# Patient Record
Sex: Male | Born: 1975
Health system: Southern US, Community
[De-identification: ages and names within clinical notes are randomized; demographics above are authoritative.]

## PROBLEM LIST (undated history)

## (undated) DIAGNOSIS — F319 Bipolar disorder, unspecified: Secondary | ICD-10-CM

---

## 2011-07-10 ENCOUNTER — Emergency Department (HOSPITAL_COMMUNITY)
Admission: EM | Admit: 2011-07-10 | Discharge: 2011-07-10 | Disposition: A | Discharge status: Home / Self Care | Payer: Medicare Other | Attending: Emergency Medicine | Admitting: Emergency Medicine

## 2011-07-10 DIAGNOSIS — M5430 Sciatica, unspecified side: Secondary | ICD-10-CM | POA: Insufficient documentation

## 2011-07-10 DIAGNOSIS — X500XXA Overexertion from strenuous movement or load, initial encounter: Secondary | ICD-10-CM | POA: Insufficient documentation

## 2011-07-10 DIAGNOSIS — M545 Low back pain, unspecified: Secondary | ICD-10-CM | POA: Insufficient documentation

## 2011-07-10 DIAGNOSIS — M79609 Pain in unspecified limb: Secondary | ICD-10-CM | POA: Insufficient documentation

## 2011-07-10 DIAGNOSIS — Z79899 Other long term (current) drug therapy: Secondary | ICD-10-CM | POA: Insufficient documentation

## 2011-07-10 DIAGNOSIS — F0392 Unspecified dementia, unspecified severity, with psychotic disturbance: Secondary | ICD-10-CM | POA: Insufficient documentation

## 2011-07-10 DIAGNOSIS — F0390 Unspecified dementia without behavioral disturbance: Secondary | ICD-10-CM | POA: Insufficient documentation

## 2014-12-12 ENCOUNTER — Other Ambulatory Visit: Payer: Self-pay | Admitting: Family Medicine

## 2014-12-12 DIAGNOSIS — M545 Low back pain: Secondary | ICD-10-CM

## 2015-01-09 ENCOUNTER — Ambulatory Visit
Admission: RE | Admit: 2015-01-09 | Discharge: 2015-01-09 | Disposition: A | Discharge status: Home / Self Care | Payer: Medicare Other | Source: Ambulatory Visit | Attending: Family Medicine | Admitting: Family Medicine

## 2015-01-09 DIAGNOSIS — M545 Low back pain: Secondary | ICD-10-CM

## 2017-10-27 ENCOUNTER — Emergency Department (HOSPITAL_BASED_OUTPATIENT_CLINIC_OR_DEPARTMENT_OTHER): Payer: MEDICARE

## 2017-10-27 ENCOUNTER — Other Ambulatory Visit: Payer: Self-pay

## 2017-10-27 ENCOUNTER — Encounter (HOSPITAL_BASED_OUTPATIENT_CLINIC_OR_DEPARTMENT_OTHER): Payer: Self-pay

## 2017-10-27 ENCOUNTER — Emergency Department (HOSPITAL_BASED_OUTPATIENT_CLINIC_OR_DEPARTMENT_OTHER)
Admission: EM | Admit: 2017-10-27 | Discharge: 2017-10-27 | Disposition: A | Discharge status: Home / Self Care | Payer: MEDICARE | Attending: Emergency Medicine | Admitting: Emergency Medicine

## 2017-10-27 DIAGNOSIS — F17200 Nicotine dependence, unspecified, uncomplicated: Secondary | ICD-10-CM | POA: Insufficient documentation

## 2017-10-27 DIAGNOSIS — J9801 Acute bronchospasm: Secondary | ICD-10-CM | POA: Diagnosis not present

## 2017-10-27 DIAGNOSIS — J069 Acute upper respiratory infection, unspecified: Secondary | ICD-10-CM

## 2017-10-27 DIAGNOSIS — R05 Cough: Secondary | ICD-10-CM | POA: Diagnosis present

## 2017-10-27 HISTORY — DX: Bipolar disorder, unspecified: F31.9

## 2017-10-27 MED ORDER — AEROCHAMBER PLUS W/MASK MISC
1.0000 | Freq: Once | Status: AC
  Administered 2017-10-27: 1
  Filled 2017-10-27: qty 1

## 2017-10-27 MED ORDER — ALBUTEROL SULFATE HFA 108 (90 BASE) MCG/ACT IN AERS
2.0000 | INHALATION_SPRAY | Freq: Once | RESPIRATORY_TRACT | Status: AC
Start: 2017-10-27 — End: 2017-10-27
  Administered 2017-10-27: 2 via RESPIRATORY_TRACT

## 2017-10-27 MED ORDER — AZITHROMYCIN 250 MG PO TABS: ORAL_TABLET | ORAL | 0 refills | Status: DC

## 2017-10-27 MED ORDER — PREDNISONE 10 MG (21) PO TBPK: ORAL_TABLET | Freq: Every day | ORAL | 0 refills | Status: DC

## 2017-10-27 MED FILL — AZITHROMYCIN 250 MG TABLET: 250 | 5 days supply | Qty: 6 | Fill #0

## 2017-10-27 MED FILL — predniSONE 10 MG TABS: 10 | 12 days supply | Qty: 42 | Fill #0

## 2017-10-27 NOTE — Discharge Instructions (Signed)
Get help right away if: You have severe or persistent: Headache. Ear pain. Sinus pain. Chest pain. You have chronic lung disease and any of the following: Wheezing. Prolonged cough. Coughing up blood. A change in your usual mucus. You have a stiff neck. You have changes in your: Vision. Hearing. Thinking. Mood.

## 2017-10-27 NOTE — ED Provider Notes (Signed)
[place] EMERGENCY DEPARTMENT Provider Note   CSN: [id number] Arrival date & time: 10/27/17  1352     History   Chief Complaint Chief Complaint  Patient presents with  . Cough    HPI Jerry Woods is a 42 y.o. male who is a chronic daily smoker the presents emergency department with chief complaint of cough.  Patient states that his cough is been present for about 1 month.  It seemed to be getting better until about a week ago when he went to [place] to visit family and is acutely worsened.  He has the paroxysms of severe cough and breathlessness without vomiting.  He denies production of phlegm.  He does not have any fever or chills.  He has no previous history of back bronchospasm or asthma.  HPI  Past Medical History:  Diagnosis Date  . Bipolar disorder ([place])     There are no active problems to display for this patient.   History reviewed. No pertinent surgical history.     Home Medications    Prior to Admission medications   Not on File    Family History No family history on file.  Social History Social History   Tobacco Use  . Smoking status: Current Every Day Smoker  . Smokeless tobacco: Never Used  Substance Use Topics  . Alcohol use: No    Frequency: Never  . Drug use: No     Allergies   Penicillins and Strawberry (diagnostic)   Review of Systems Review of Systems  Positive for cough, negative for fever or chills. Physical Exam Updated Vital Signs BP (!) 130/94 (BP Location: Left Arm)   Pulse (!) 107   Temp 98.2 F (36.8 C) (Oral)   Resp 20   Ht 5' 10"  (1.778 m)   Wt 105.7 kg (233 lb)   SpO2 98%   BMI 33.43 kg/m   Physical Exam  Constitutional: He appears well-developed and well-nourished. No distress.  HENT:  Head: Normocephalic and atraumatic.  Eyes: Conjunctivae are normal. No scleral icterus.  Neck: Normal range of motion. Neck supple.  Cardiovascular: Normal rate, regular rhythm and normal heart sounds.    Pulmonary/Chest: Effort normal. No respiratory distress. He has wheezes.  Abdominal: Soft. There is no tenderness.  Musculoskeletal: He exhibits no edema.  Neurological: He is alert.  Skin: Skin is warm and dry. He is not diaphoretic.  Psychiatric: His behavior is normal.  Nursing note and vitals reviewed.    ED Treatments / Results  Labs (all labs ordered are listed, but only abnormal results are displayed) Labs Reviewed - No data to display  EKG  EKG Interpretation None       Radiology Dg Chest 2 View  Result Date: 10/27/2017 CLINICAL DATA:  Cough and congestion for 1 month.  Smoker. EXAM: CHEST  2 VIEW COMPARISON:  None. FINDINGS: There is some peribronchial thickening. No consolidative process, pneumothorax or effusion. Heart size is normal. No bony abnormality. IMPRESSION: Bronchitic change.  No focal process. Electronically Signed   By: Inge Rise M.D.   On: 10/27/2017 15:52    Procedures Procedures (including critical care time)  Medications Ordered in ED Medications  albuterol (PROVENTIL HFA;VENTOLIN HFA) 108 (90 Base) MCG/ACT inhaler 2 puff (not administered)  aerochamber plus with mask device 1 each (not administered)     Initial Impression / Assessment and Plan / ED Course  I have reviewed the triage vital signs and the nursing notes.  Pertinent labs & imaging results that  were available during my care of the patient were reviewed by me and considered in my medical decision making (see chart for details).     Patient with upper respiratory infection, given the course of his disease with improvement followed by worsening in symptoms we will treat prophylactically with antibiotics.  Patient is also appears to have a reactive airway component and will be treated with albuterol and prednisone.  I discussed return precautions with the patient.  He may use over-the-counter cough suppressants.  He appears appropriate for discharge at this time.  I discussed  smoking cessation with the patient including written materials, family support, tips for avoiding cigarette, stress and anxiety support.  Final Clinical Impressions(s) / ED Diagnoses   Final diagnoses:  Upper respiratory tract infection, unspecified type  Bronchospasm    ED Discharge Orders    None       Margarita Mail, PA-C 10/27/17 1637    Long, [facility], MD 10/28/17 [phone]

## 2017-10-27 NOTE — ED Triage Notes (Signed)
C/o flu like sx x 1 month-worse x 1 week-NAD-steady gait

## 2019-01-29 IMAGING — CR DG CHEST 2V
2 series · 2 of 2 positions shown · non-contrast
Comparison: None.

CLINICAL DATA: Cough and congestion for 1 month.  Smoker.

EXAM:
CHEST  2 VIEW

[w chest pa]
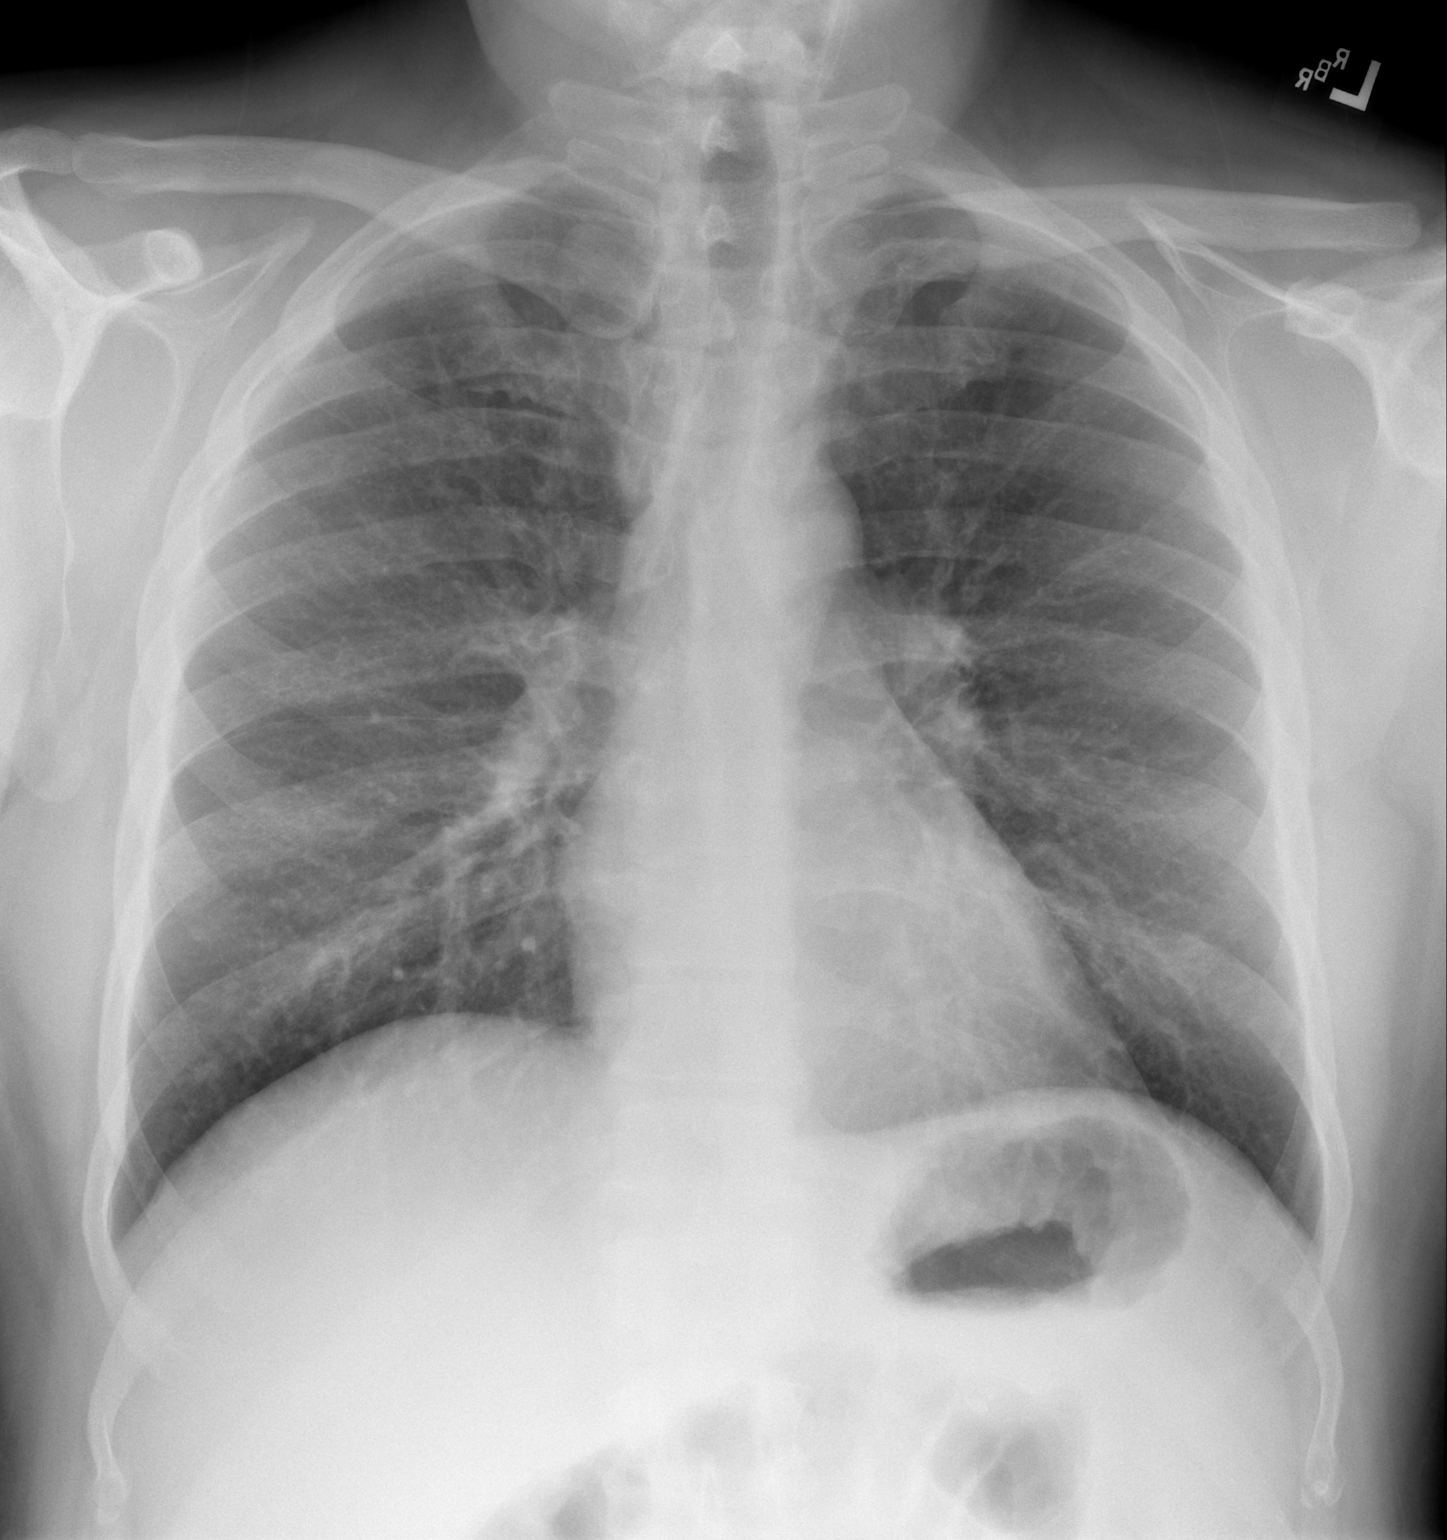

[w chest lat]
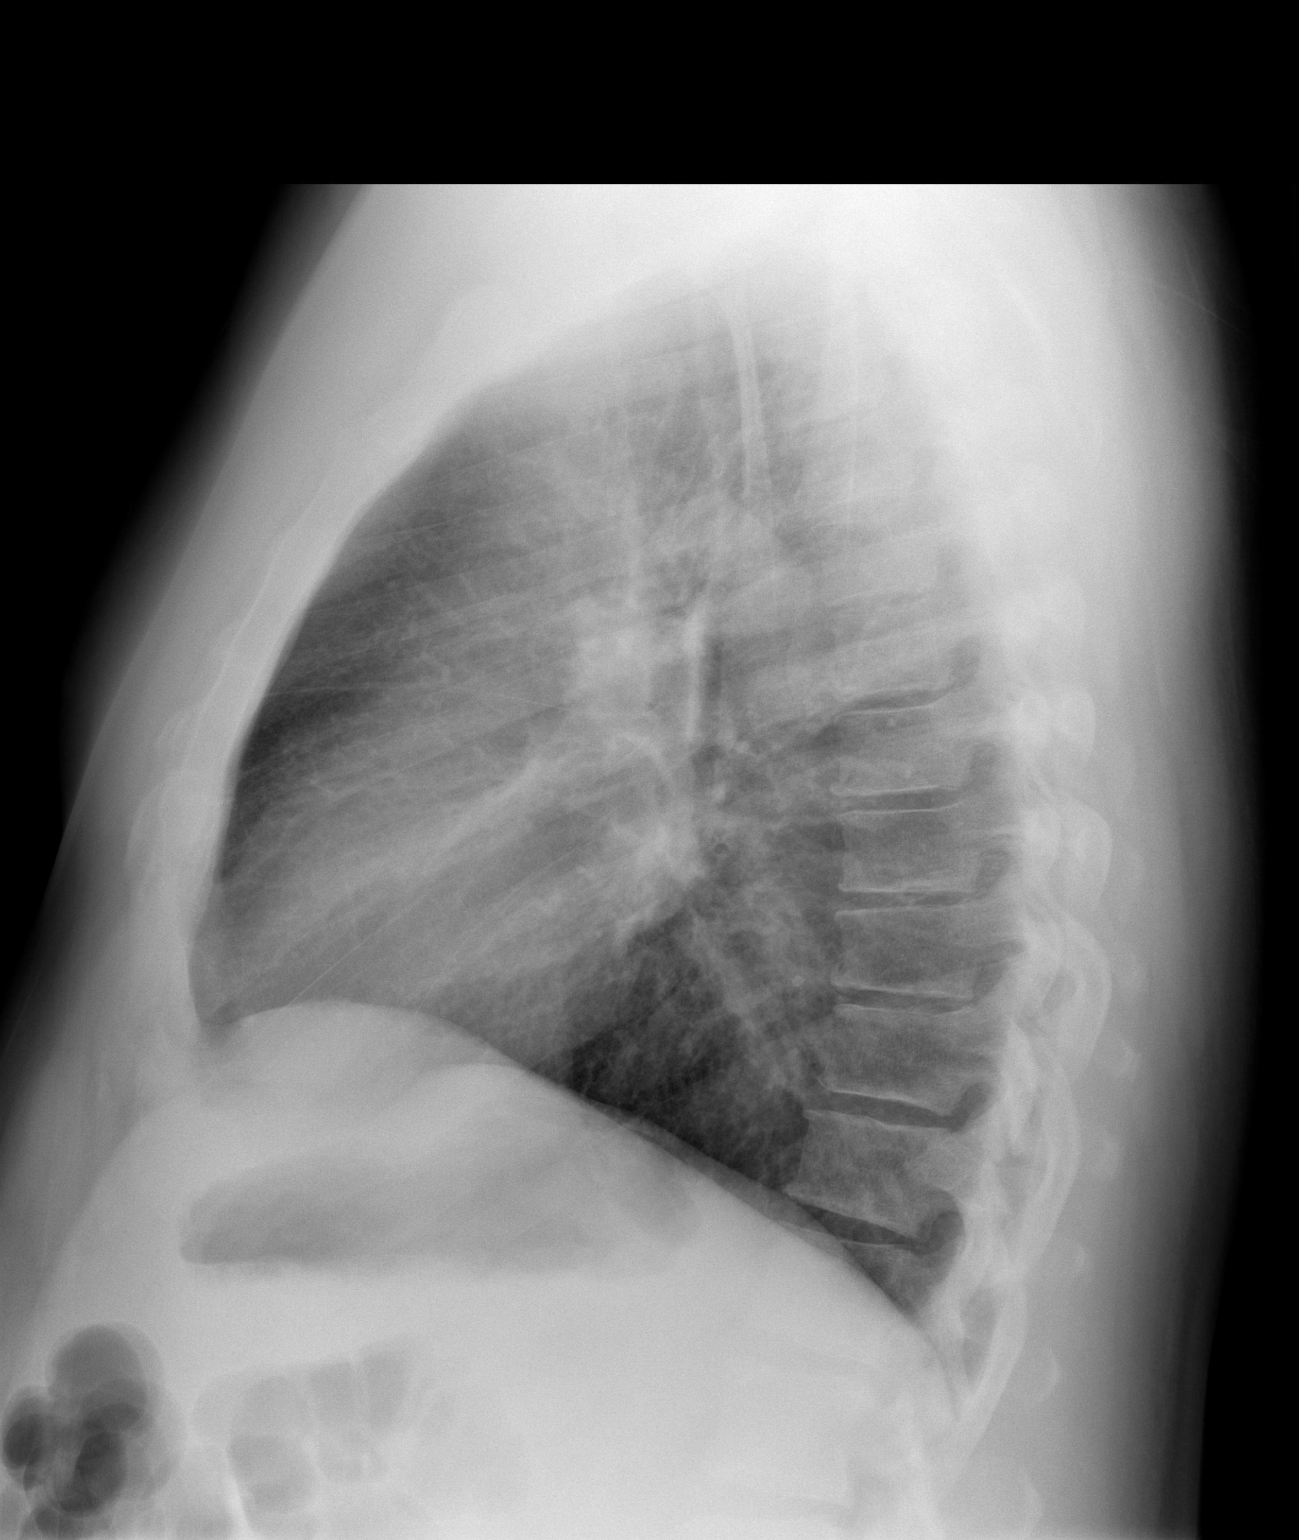

[2 of 2 positions shown; findings below may reference images not displayed]

FINDINGS: There is some peribronchial thickening. No consolidative process,
pneumothorax or effusion. Heart size is normal. No bony abnormality.
IMPRESSION: Bronchitic change.  No focal process.

## 2020-08-17 ENCOUNTER — Emergency Department (HOSPITAL_BASED_OUTPATIENT_CLINIC_OR_DEPARTMENT_OTHER): Payer: Medicare PPO

## 2020-08-17 ENCOUNTER — Inpatient Hospital Stay (HOSPITAL_BASED_OUTPATIENT_CLINIC_OR_DEPARTMENT_OTHER)
Admission: EM | Admit: 2020-08-17 | Discharge: 2020-08-21 | DRG: 177 | Disposition: A | Discharge status: Home / Self Care | Payer: Medicare PPO | Attending: Internal Medicine | Admitting: Internal Medicine

## 2020-08-17 ENCOUNTER — Encounter (HOSPITAL_BASED_OUTPATIENT_CLINIC_OR_DEPARTMENT_OTHER): Payer: Self-pay | Admitting: *Deleted

## 2020-08-17 ENCOUNTER — Other Ambulatory Visit: Payer: Self-pay

## 2020-08-17 DIAGNOSIS — Z880 Allergy status to penicillin: Secondary | ICD-10-CM

## 2020-08-17 DIAGNOSIS — J1282 Pneumonia due to coronavirus disease 2019: Secondary | ICD-10-CM | POA: Diagnosis present

## 2020-08-17 DIAGNOSIS — Z23 Encounter for immunization: Secondary | ICD-10-CM | POA: Diagnosis present

## 2020-08-17 DIAGNOSIS — E669 Obesity, unspecified: Secondary | ICD-10-CM | POA: Diagnosis present

## 2020-08-17 DIAGNOSIS — J9601 Acute respiratory failure with hypoxia: Secondary | ICD-10-CM | POA: Diagnosis not present

## 2020-08-17 DIAGNOSIS — F17210 Nicotine dependence, cigarettes, uncomplicated: Secondary | ICD-10-CM | POA: Diagnosis present

## 2020-08-17 DIAGNOSIS — Z91048 Other nonmedicinal substance allergy status: Secondary | ICD-10-CM | POA: Diagnosis not present

## 2020-08-17 DIAGNOSIS — U071 COVID-19: Principal | ICD-10-CM | POA: Diagnosis present

## 2020-08-17 DIAGNOSIS — Z6834 Body mass index (BMI) 34.0-34.9, adult: Secondary | ICD-10-CM

## 2020-08-17 DIAGNOSIS — Z79899 Other long term (current) drug therapy: Secondary | ICD-10-CM

## 2020-08-17 DIAGNOSIS — R7401 Elevation of levels of liver transaminase levels: Secondary | ICD-10-CM | POA: Diagnosis present

## 2020-08-17 DIAGNOSIS — F319 Bipolar disorder, unspecified: Secondary | ICD-10-CM | POA: Diagnosis present

## 2020-08-17 DIAGNOSIS — J159 Unspecified bacterial pneumonia: Secondary | ICD-10-CM | POA: Diagnosis present

## 2020-08-17 LAB — CBC WITH DIFFERENTIAL/PLATELET
Abs Immature Granulocytes: 0.05 10*3/uL (ref 0.00–0.07)
Basophils Absolute: 0 10*3/uL (ref 0.0–0.1)
Basophils Relative: 0 %
Eosinophils Absolute: 0 10*3/uL (ref 0.0–0.5)
Eosinophils Relative: 0 %
HCT: 43.1 % (ref 39.0–52.0)
Hemoglobin: 15 g/dL (ref 13.0–17.0)
Immature Granulocytes: 1 %
Lymphocytes Relative: 13 %
Lymphs Abs: 0.9 10*3/uL (ref 0.7–4.0)
MCH: 29.8 pg (ref 26.0–34.0)
MCHC: 34.8 g/dL (ref 30.0–36.0)
MCV: 85.5 fL (ref 80.0–100.0)
Monocytes Absolute: 0.2 10*3/uL (ref 0.1–1.0)
Monocytes Relative: 3 %
Neutro Abs: 5.7 10*3/uL (ref 1.7–7.7)
Neutrophils Relative %: 83 %
Platelets: 205 10*3/uL (ref 150–400)
RBC: 5.04 MIL/uL (ref 4.22–5.81)
RDW: 13.9 % (ref 11.5–15.5)
Smear Review: NORMAL
WBC: 6.8 10*3/uL (ref 4.0–10.5)
nRBC: 0 % (ref 0.0–0.2)

## 2020-08-17 LAB — LACTATE DEHYDROGENASE: LDH: 442 U/L — ABNORMAL HIGH (ref 98–192)

## 2020-08-17 LAB — RESPIRATORY PANEL BY RT PCR (FLU A&B, COVID)
Influenza A by PCR: NEGATIVE
Influenza B by PCR: NEGATIVE
SARS Coronavirus 2 by RT PCR: POSITIVE — AB

## 2020-08-17 LAB — PROCALCITONIN: Procalcitonin: 1.3 ng/mL

## 2020-08-17 LAB — COMPREHENSIVE METABOLIC PANEL
ALT: 53 U/L — ABNORMAL HIGH (ref 0–44)
AST: 88 U/L — ABNORMAL HIGH (ref 15–41)
Albumin: 3 g/dL — ABNORMAL LOW (ref 3.5–5.0)
Alkaline Phosphatase: 48 U/L (ref 38–126)
Anion gap: 15 (ref 5–15)
BUN: 15 mg/dL (ref 6–20)
CO2: 23 mmol/L (ref 22–32)
Calcium: 8.2 mg/dL — ABNORMAL LOW (ref 8.9–10.3)
Chloride: 97 mmol/L — ABNORMAL LOW (ref 98–111)
Creatinine, Ser: 0.87 mg/dL (ref 0.61–1.24)
GFR, Estimated: >60 mL/min (ref >60)
Glucose, Bld: 109 mg/dL — ABNORMAL HIGH (ref 70–99)
Potassium: 3.7 mmol/L (ref 3.5–5.1)
Sodium: 135 mmol/L (ref 135–145)
Total Bilirubin: 0.7 mg/dL (ref 0.3–1.2)
Total Protein: 7 g/dL (ref 6.5–8.1)

## 2020-08-17 LAB — FERRITIN: Ferritin: 1508 ng/mL — ABNORMAL HIGH (ref 24–336)

## 2020-08-17 LAB — D-DIMER, QUANTITATIVE: D-Dimer, Quant: 1.14 ug/mL-FEU — ABNORMAL HIGH (ref 0.00–0.50)

## 2020-08-17 LAB — C-REACTIVE PROTEIN: CRP: 18.6 mg/dL — ABNORMAL HIGH (ref <1.0)

## 2020-08-17 LAB — TRIGLYCERIDES: Triglycerides: 136 mg/dL (ref <150)

## 2020-08-17 LAB — FIBRINOGEN: Fibrinogen: >800 mg/dL — ABNORMAL HIGH (ref 210–475)

## 2020-08-17 LAB — LACTIC ACID, PLASMA: Lactic Acid, Venous: 1.7 mmol/L (ref 0.5–1.9)

## 2020-08-17 MED ORDER — SODIUM CHLORIDE 0.9 % IV SOLN: 500.0000 mg | INTRAVENOUS | Status: DC

## 2020-08-17 MED ORDER — DEXAMETHASONE SODIUM PHOSPHATE 10 MG/ML IJ SOLN
6.0000 mg | INTRAMUSCULAR | Status: DC
  Administered 2020-08-17: 6 mg via INTRAVENOUS
  Filled 2020-08-17: qty 1

## 2020-08-17 MED ORDER — ONDANSETRON HCL 4 MG/2ML IJ SOLN: 4.0000 mg | Freq: Four times a day (QID) | INTRAMUSCULAR | Status: DC | PRN

## 2020-08-17 MED ORDER — ACETAMINOPHEN 325 MG PO TABS: 650.0000 mg | ORAL_TABLET | Freq: Four times a day (QID) | ORAL | Status: DC | PRN

## 2020-08-17 MED ORDER — POLYETHYLENE GLYCOL 3350 17 G PO PACK: 17.0000 g | PACK | Freq: Every day | ORAL | Status: DC | PRN

## 2020-08-17 MED ORDER — QUETIAPINE FUMARATE ER 300 MG PO TB24
300.0000 mg | ORAL_TABLET | Freq: Every day | ORAL | Status: DC
  Filled 2020-08-17: qty 1

## 2020-08-17 MED ORDER — LEVOFLOXACIN IN D5W 750 MG/150ML IV SOLN
750.0000 mg | INTRAVENOUS | Status: DC
  Administered 2020-08-17 – 2020-08-20 (×4): 750 mg via INTRAVENOUS
  Filled 2020-08-17 (×4): qty 150

## 2020-08-17 MED ORDER — SODIUM CHLORIDE 0.9 % IV SOLN
100.0000 mg | Freq: Every day | INTRAVENOUS | Status: AC
  Administered 2020-08-18 – 2020-08-21 (×4): 100 mg via INTRAVENOUS
  Filled 2020-08-17 (×4): qty 20

## 2020-08-17 MED ORDER — ASCORBIC ACID 500 MG PO TABS
500.0000 mg | ORAL_TABLET | Freq: Every day | ORAL | Status: DC
  Administered 2020-08-17 – 2020-08-21 (×5): 500 mg via ORAL
  Filled 2020-08-17 (×5): qty 1

## 2020-08-17 MED ORDER — INFLUENZA VAC SPLIT QUAD 0.5 ML IM SUSY
0.5000 mL | PREFILLED_SYRINGE | INTRAMUSCULAR | Status: AC
  Administered 2020-08-21: 0.5 mL via INTRAMUSCULAR
  Filled 2020-08-17: qty 0.5

## 2020-08-17 MED ORDER — ALBUTEROL SULFATE HFA 108 (90 BASE) MCG/ACT IN AERS
2.0000 | INHALATION_SPRAY | RESPIRATORY_TRACT | Status: DC | PRN
  Filled 2020-08-17: qty 6.7

## 2020-08-17 MED ORDER — DEXAMETHASONE SODIUM PHOSPHATE 10 MG/ML IJ SOLN
10.0000 mg | Freq: Once | INTRAMUSCULAR | Status: AC
  Administered 2020-08-17: 10 mg via INTRAVENOUS
  Filled 2020-08-17: qty 1

## 2020-08-17 MED ORDER — SODIUM CHLORIDE 0.9 % IV SOLN
200.0000 mg | Freq: Once | INTRAVENOUS | Status: AC
  Administered 2020-08-17: 200 mg via INTRAVENOUS
  Filled 2020-08-17: qty 40

## 2020-08-17 MED ORDER — SODIUM CHLORIDE 0.9 % IV SOLN: 2.0000 g | INTRAVENOUS | Status: DC

## 2020-08-17 MED ORDER — ZINC SULFATE 220 (50 ZN) MG PO CAPS
220.0000 mg | ORAL_CAPSULE | Freq: Every day | ORAL | Status: DC
  Administered 2020-08-17 – 2020-08-21 (×5): 220 mg via ORAL
  Filled 2020-08-17 (×5): qty 1

## 2020-08-17 MED ORDER — LACTATED RINGERS IV SOLN: INTRAVENOUS | Status: AC

## 2020-08-17 MED ORDER — GUAIFENESIN-DM 100-10 MG/5ML PO SYRP: 10.0000 mL | ORAL_SOLUTION | ORAL | Status: DC | PRN

## 2020-08-17 MED ORDER — SODIUM CHLORIDE 0.9 % IV BOLUS
500.0000 mL | Freq: Once | INTRAVENOUS | Status: AC
  Administered 2020-08-17: 500 mL via INTRAVENOUS

## 2020-08-17 MED ORDER — ENOXAPARIN SODIUM 40 MG/0.4ML ~~LOC~~ SOLN
40.0000 mg | SUBCUTANEOUS | Status: DC
  Administered 2020-08-17: 40 mg via SUBCUTANEOUS
  Filled 2020-08-17: qty 0.4

## 2020-08-17 MED ORDER — ONDANSETRON HCL 4 MG PO TABS: 4.0000 mg | ORAL_TABLET | Freq: Four times a day (QID) | ORAL | Status: DC | PRN

## 2020-08-17 NOTE — ED Provider Notes (Signed)
[facility] Emergency Department Provider Note MRN:  [id number]  Arrival date & time: 08/17/20     Chief Complaint   Covid Symptoms (+ COVID)   History of Present Illness   Jerry Woods is a 44 y.o. year-old male with a history of bipolar disorder presenting to the ED with chief complaint of malaise.  Patient has been having cough, body aches, sore throat, shortness of breath, increased fatigue for the past week.  Has been caring for his wife at home who was diagnosed with COVID-19.  Patient is not vaccinated.  Symptoms are worsening, moderate in severity, no exacerbating or alleviating factors.  Denies chest pain, endorsing intermittent fevers.  Review of Systems  A complete 10 system review of systems was obtained and all systems are negative except as noted in the HPI and PMH.   Patient's Health History    Past Medical History:  Diagnosis Date  . Bipolar disorder ([place])     History reviewed. No pertinent surgical history.  No family history on file.  Social History   Socioeconomic History  . Marital status: Married    Spouse name: Not on file  . Number of children: Not on file  . Years of education: Not on file  . Highest education level: Not on file  Occupational History  . Not on file  Tobacco Use  . Smoking status: Current Every Day Smoker  . Smokeless tobacco: Never Used  Substance and Sexual Activity  . Alcohol use: No  . Drug use: No  . Sexual activity: Not on file  Other Topics Concern  . Not on file  Social History Narrative  . Not on file   Social Determinants of Health   Financial Resource Strain:   . Difficulty of Paying Living Expenses: Not on file  Food Insecurity:   . Worried About Charity fundraiser in the Last Year: Not on file  . Ran Out of Food in the Last Year: Not on file  Transportation Needs:   . Lack of Transportation (Medical): Not on file  . Lack of Transportation (Non-Medical): Not on file  Physical  Activity:   . Days of Exercise per Week: Not on file  . Minutes of Exercise per Session: Not on file  Stress:   . Feeling of Stress : Not on file  Social Connections:   . Frequency of Communication with Friends and Family: Not on file  . Frequency of Social Gatherings with Friends and Family: Not on file  . Attends Religious Services: Not on file  . Active Member of Clubs or Organizations: Not on file  . Attends Archivist Meetings: Not on file  . Marital Status: Not on file  Intimate Partner Violence:   . Fear of Current or Ex-Partner: Not on file  . Emotionally Abused: Not on file  . Physically Abused: Not on file  . Sexually Abused: Not on file     Physical Exam   Vitals:   08/17/20 1300 08/17/20 1400  BP: 115/63 (!) 129/91  Pulse: (!) 120 100  Resp:  (!) 21  Temp:    SpO2: 93% 95%    CONSTITUTIONAL: Well-appearing, NAD NEURO:  Alert and oriented x 3, no focal deficits EYES:  eyes equal and reactive ENT/NECK:  no LAD, no JVD CARDIO: Tachycardic rate, well-perfused, normal S1 and S2 PULM:  CTAB no wheezing or rhonchi GI/GU:  normal bowel sounds, non-distended, non-tender MSK/SPINE:  No gross deformities, no edema SKIN:  no rash, atraumatic PSYCH:  Appropriate speech and behavior  *Additional and/or pertinent findings included in MDM below  Diagnostic and Interventional Summary    EKG Interpretation  Date/Time:  Friday August 17 2020 13:51:44 EDT Ventricular Rate:  99 PR Interval:    QRS Duration: 97 QT Interval:  347 QTC Calculation: 446 R Axis:   37 Text Interpretation: Sinus rhythm RSR' in V1 or V2, right VCD or RVH Confirmed by Gerlene Fee [phone]) on 08/17/2020 2:04:10 PM      Labs Reviewed  RESPIRATORY PANEL BY RT PCR (FLU A&B, COVID) - Abnormal; Notable for the following components:      Result Value   SARS Coronavirus 2 by RT PCR POSITIVE (*)    All other components within normal limits  COMPREHENSIVE METABOLIC PANEL - Abnormal;  Notable for the following components:   Chloride 97 (*)    Glucose, Bld 109 (*)    Calcium 8.2 (*)    Albumin 3.0 (*)    AST 88 (*)    ALT 53 (*)    All other components within normal limits  D-DIMER, QUANTITATIVE (NOT AT [facility]) - Abnormal; Notable for the following components:   D-Dimer, Quant 1.14 (*)    All other components within normal limits  CULTURE, BLOOD (ROUTINE X 2)  CULTURE, BLOOD (ROUTINE X 2)  LACTIC ACID, PLASMA  CBC WITH DIFFERENTIAL/PLATELET  LACTIC ACID, PLASMA  PROCALCITONIN  LACTATE DEHYDROGENASE  FERRITIN  TRIGLYCERIDES  FIBRINOGEN  C-REACTIVE PROTEIN    DG Chest Port 1 View  Final Result      Medications  dexamethasone (DECADRON) injection 10 mg (10 mg Intravenous Given 08/17/20 1344)  sodium chloride 0.9 % bolus 500 mL (0 mLs Intravenous Stopped 08/17/20 1434)     Procedures  /  Critical Care Procedures  ED Course and Medical Decision Making  I have reviewed the triage vital signs, the nursing notes, and pertinent available records from the EMR.  Listed above are laboratory and imaging tests that I personally ordered, reviewed, and interpreted and then considered in my medical decision making (see below for details).  COVID-19 with new oxygen requirement.  On room air at rest patient is with oxygen saturation in the low 90s.  When I stand him up to walk in place, after only 10 seconds he is out of breath with heart rate 120-130 and he desaturates to 88%.  He is currently on 2 L nasal cannula for comfort.  Awaiting remainder of Covid preadmission labs, will need admission.       Barth Kirks. Sedonia Small, MD [place] [email or web address]  Final Clinical Impressions(s) / ED Diagnoses     ICD-10-CM   1. COVID-19  U07.1     ED Discharge Orders    None       Discharge Instructions Discussed with and Provided to Patient:   Discharge Instructions   None       Maudie Flakes, MD 08/17/20  1549

## 2020-08-17 NOTE — H&P (Addendum)
History and Physical    Jerry Woods WUJ:[id number] DOB: 1976/06/12 DOA: 08/17/2020  PCP: Katherina Mires, MD  Patient coming from: Home   Chief Complaint:  Chief Complaint  Patient presents with  . Covid Symptoms    + COVID     HPI:    44 year old male with past medical history of bipolar disorder who presents to [place] emergency department with complaints of shortness of breath and cough.  Patient explains that approximately 1 week ago he began to develop frequent bouts of watery diarrhea as well as abdominal cramping.  The symptoms were associated with poor appetite, generalized weakness and generalized body aches.  Patient explains that in the week preceding the onset of his symptoms his wife was diagnosed with COVID-19.  Patient states that after several days of watery diarrhea the symptoms slowly began to resolve however the patient began to develop a dry nonproductive cough.  This dry nonproductive cough was associated with shortness of breath.  Shortness of breath was initially mild in intensity but progressively became more severe over the next several days.  Shortness of breath is worse with exertion and improved with rest.  As patient symptoms persisted patient began to also develop fevers as high as 102 F.  Patient symptoms continue to worsen until he eventually presented to [place] emergency department for evaluation.  Upon evaluation in the [place] emergency department patient was found to be short of breath with evidence of bilateral infiltrates concerning for COVID-19 infection on chest x-ray.  COVID-19 PCR testing ended up being positive.  Patient was found to be hypoxic with any movement whatsoever requiring supplemental oxygen.  Hospitalist group was then called to assist the patient for admission to [facility] for further management.  Patient has been accepted to the Covid unit at [facility].  Review of Systems:    Review of Systems  Constitutional: Positive for fever and malaise/fatigue.  Respiratory: Positive for cough and shortness of breath.   Gastrointestinal: Positive for abdominal pain and diarrhea.  Neurological: Positive for weakness.    Past Medical History:  Diagnosis Date  . Bipolar disorder ([place])     History reviewed. No pertinent surgical history.   reports that he has quit smoking. He has never used smokeless tobacco. He reports that he does not drink alcohol and does not use drugs.  Allergies  Allergen Reactions  . Penicillins Anaphylaxis, Swelling, Rash and Other (See Comments)    "Made my throat swell"  . Strawberry Extract Rash    Family History  Problem Relation Age of Onset  . Colon cancer Father   . Heart disease Neg Hx      Prior to Admission medications   Medication Sig Start Date End Date Taking? Authorizing Provider  dextromethorphan-guaiFENesin (MUCINEX DM) 30-600 MG 12hr tablet Take 1 tablet by mouth 2 (two) times daily as needed for cough.   Yes [provider]  naproxen sodium (ALEVE) 220 MG tablet Take 220-440 mg by mouth 2 (two) times daily as needed (for headaches or pain).   Yes [provider]  Pseudoephedrine-APAP-DM (DAYQUIL MULTI-SYMPTOM COLD/FLU PO) Take 1 capsule by mouth every 6 (six) hours as needed (for symptoms).   Yes [provider]  azithromycin (ZITHROMAX Z-PAK) 250 MG tablet 2 po day one, then 1 daily x 4 days Patient not taking: Reported on 08/17/2020 10/27/17   Margarita Mail, PA-C  lamoTRIgine (LAMICTAL) 100 MG tablet Take 100 mg by mouth  daily as needed ("to stabilize").  06/21/20   [provider]  predniSONE (STERAPRED UNI-PAK 21 TAB) 10 MG (21) TBPK tablet Take by mouth daily. Take 6 tabs by mouth daily  for 2 days, then 5 tabs for 2 days, then 4 tabs for 2 days, then 3 tabs for 2 days, 2 tabs for 2 days, then 1 tab by mouth daily for 2 days Patient not taking: Reported on 08/17/2020 10/27/17    Margarita Mail, PA-C  QUEtiapine (SEROQUEL XR) 300 MG 24 hr tablet Take 300 mg by mouth at bedtime. 06/21/20   [provider]    Physical Exam: Vitals:   08/17/20 1530 08/17/20 1600 08/17/20 1633 08/17/20 1800  BP: 114/68 (!) 136/52 128/74 (!) 129/92  Pulse: 96 98 96 93  Resp:  17 (!) 32 19  Temp:   98.6 F (37 C) 98.1 F (36.7 C)  TempSrc:   Oral Axillary  SpO2: 96% 95% 96% 91%  Weight:    109.1 kg  Height:    5' 10"  (1.778 m)    Constitutional: Acute alert and oriented x3, no associated distress.   Skin: no rashes, no lesions, notable poor skin turgor. Eyes: Pupils are equally reactive to light.  No evidence of scleral icterus or conjunctival pallor.  ENMT: Dry mucous membranes noted.  Posterior pharynx clear of any exudate or lesions.   Neck: normal, supple, no masses, no thyromegaly.  No evidence of jugular venous distension.   Respiratory: Patchy bilateral mid and lower field rales without any evidence of associated wheezing.  Normal respiratory effort. No accessory muscle use.  Cardiovascular: Regular rate and rhythm, no murmurs / rubs / gallops. No extremity edema. 2+ pedal pulses. No carotid bruits.  Chest:   Nontender without crepitus or deformity.   Back:   Nontender without crepitus or deformity. Abdomen: Abdomen is soft and nontender.  No evidence of intra-abdominal masses.  Positive bowel sounds noted in all quadrants.   Musculoskeletal: No joint deformity upper and lower extremities. Good ROM, no contractures. Normal muscle tone.  Neurologic: CN 2-12 grossly intact. Sensation intact.  Patient moving all 4 extremities spontaneously.  Patient is following all commands.  Patient is responsive to verbal stimuli.   Psychiatric: Patient exhibits normal mood with appropriate affect.  Patient seems to possess insight as to their current situation.     Labs on Admission: I have personally reviewed following labs and imaging studies -   CBC: Recent Labs  Lab  08/17/20 1329  WBC 6.8  NEUTROABS 5.7  HGB 15.0  HCT 43.1  MCV 85.5  PLT 916   Basic Metabolic Panel: Recent Labs  Lab 08/17/20 1329  NA 135  K 3.7  CL 97*  CO2 23  GLUCOSE 109*  BUN 15  CREATININE 0.87  CALCIUM 8.2*   GFR: Estimated Creatinine Clearance: 132.6 mL/min (by C-G formula based on SCr of 0.87 mg/dL). Liver Function Tests: Recent Labs  Lab 08/17/20 1329  AST 88*  ALT 53*  ALKPHOS 48  BILITOT 0.7  PROT 7.0  ALBUMIN 3.0*   No results for input(s): LIPASE, AMYLASE in the last 168 hours. No results for input(s): AMMONIA in the last 168 hours. Coagulation Profile: No results for input(s): INR, PROTIME in the last 168 hours. Cardiac Enzymes: No results for input(s): CKTOTAL, CKMB, CKMBINDEX, TROPONINI in the last 168 hours. BNP (last 3 results) No results for input(s): PROBNP in the last 8760 hours. HbA1C: No results for input(s): HGBA1C in the last 72  hours. CBG: No results for input(s): GLUCAP in the last 168 hours. Lipid Profile: Recent Labs    08/17/20 1329  TRIG 136   Thyroid Function Tests: No results for input(s): TSH, T4TOTAL, FREET4, T3FREE, THYROIDAB in the last 72 hours. Anemia Panel: Recent Labs    08/17/20 1329  FERRITIN 1,508*   Urine analysis: No results found for: COLORURINE, APPEARANCEUR, LABSPEC, PHURINE, GLUCOSEU, HGBUR, BILIRUBINUR, KETONESUR, PROTEINUR, UROBILINOGEN, NITRITE, LEUKOCYTESUR  Radiological Exams on Admission - Personally Reviewed: DG Chest Port 1 View  Result Date: 08/17/2020 CLINICAL DATA:  COVID symptoms.  COVID exposure. EXAM: PORTABLE CHEST 1 VIEW COMPARISON:  10/27/2017. FINDINGS: Heart size normal. Low lung volumes with diffuse prominent bilateral pulmonary infiltrates. COVID pneumonia could present in this fashion. No pleural effusion or pneumothorax. IMPRESSION: Low lung volumes with diffuse prominent bilateral pulmonary infiltrates. COVID pneumonia could present in this fashion. Electronically Signed    By: Marcello Moores  Register   On: 08/17/2020 13:32    EKG: Personally reviewed.  Rhythm is normal sinus rhythm with heart rate of 99 bpm.  No dynamic ST segment changes appreciated.  Assessment/Plan Principal Problem:   COVID-19 virus infection   Patient presenting with several day history of progressively worsening shortness of breath and dry cough.  Initial evaluation revealing bilateral patchy infiltrates on chest x-ray with COVID-19 PCR testing being positive.  Considering the patient is currently saturating 93% on 2 L of oxygen via nasal cannula, initiating intravenous remdesivir and dexamethasone.  Hydrating patient with intravenous isotonic fluids.  Notable slightly elevated procalcitonin.  While this alone does not definitively suggest bacterial coinfection, will start by empirically placing patient on intravenous Levofloxacin (anaphalaxis to PCN).  If blood cultures remain negative with downtrending procalcitonin in the first 48 hours I believe the antibacterials can likely be discontinued.  Finding patient with as needed antitussive, as needed bronchodilator therapy via MDI, zinc and vitamin C.  Patient admitted to COVID-19 unit.  Active Problems:   Bipolar disorder [facility])  Patient does have a documented history of bipolar disorder patient is currently not on any pharmacotherapy  Pharmacy tech did identify the patient has been prescribed Lamictal and Seroquel in the past several months however patient is not actively taking these.  No evidence of altered mood on my evaluation and therefore these will not be resumed.    Code Status:  Full code Family Communication: deferred   Status is: Inpatient  Remains inpatient appropriate because:IV treatments appropriate due to intensity of illness or inability to take PO and Inpatient level of care appropriate due to severity of illness   Dispo: The patient is from: Home              Anticipated d/c is to: Home               Anticipated d/c date is: 2 days              Patient currently is not medically stable to d/c.        Vernelle Emerald MD [facility] Pager [phone]  If 7PM-7AM, please contact night-coverage [email or web address] Use universal [place] password for that web site. If you do not have the password, please call the hospital operator.  08/17/2020, 7:44 PM

## 2020-08-17 NOTE — ED Triage Notes (Signed)
His wife is covid positive. He has had covid symptoms for a week. Cough, body aches, sob, pale, fatigue, loss of smell.

## 2020-08-17 NOTE — ED Notes (Signed)
Report provided to [facility] for transport to [facility]

## 2020-08-18 DIAGNOSIS — U071 COVID-19: Principal | ICD-10-CM

## 2020-08-18 DIAGNOSIS — J9601 Acute respiratory failure with hypoxia: Secondary | ICD-10-CM

## 2020-08-18 LAB — COMPREHENSIVE METABOLIC PANEL
ALT: 51 U/L — ABNORMAL HIGH (ref 0–44)
AST: 75 U/L — ABNORMAL HIGH (ref 15–41)
Albumin: 2.6 g/dL — ABNORMAL LOW (ref 3.5–5.0)
Alkaline Phosphatase: 49 U/L (ref 38–126)
Anion gap: 11 (ref 5–15)
BUN: 13 mg/dL (ref 6–20)
CO2: 24 mmol/L (ref 22–32)
Calcium: 8.4 mg/dL — ABNORMAL LOW (ref 8.9–10.3)
Chloride: 102 mmol/L (ref 98–111)
Creatinine, Ser: 0.86 mg/dL (ref 0.61–1.24)
GFR, Estimated: >60 mL/min (ref >60)
Glucose, Bld: 148 mg/dL — ABNORMAL HIGH (ref 70–99)
Potassium: 4 mmol/L (ref 3.5–5.1)
Sodium: 137 mmol/L (ref 135–145)
Total Bilirubin: 0.4 mg/dL (ref 0.3–1.2)
Total Protein: 6.3 g/dL — ABNORMAL LOW (ref 6.5–8.1)

## 2020-08-18 LAB — CBC WITH DIFFERENTIAL/PLATELET
Abs Immature Granulocytes: 0.03 10*3/uL (ref 0.00–0.07)
Basophils Absolute: 0 10*3/uL (ref 0.0–0.1)
Basophils Relative: 0 %
Eosinophils Absolute: 0 10*3/uL (ref 0.0–0.5)
Eosinophils Relative: 0 %
HCT: 41.4 % (ref 39.0–52.0)
Hemoglobin: 14.2 g/dL (ref 13.0–17.0)
Immature Granulocytes: 1 %
Lymphocytes Relative: 16 %
Lymphs Abs: 0.6 10*3/uL — ABNORMAL LOW (ref 0.7–4.0)
MCH: 29.5 pg (ref 26.0–34.0)
MCHC: 34.3 g/dL (ref 30.0–36.0)
MCV: 85.9 fL (ref 80.0–100.0)
Monocytes Absolute: 0.1 10*3/uL (ref 0.1–1.0)
Monocytes Relative: 3 %
Neutro Abs: 3.1 10*3/uL (ref 1.7–7.7)
Neutrophils Relative %: 80 %
Platelets: 210 10*3/uL (ref 150–400)
RBC: 4.82 MIL/uL (ref 4.22–5.81)
RDW: 13.8 % (ref 11.5–15.5)
WBC: 3.8 10*3/uL — ABNORMAL LOW (ref 4.0–10.5)
nRBC: 0 % (ref 0.0–0.2)

## 2020-08-18 LAB — MAGNESIUM: Magnesium: 2.1 mg/dL (ref 1.7–2.4)

## 2020-08-18 LAB — C-REACTIVE PROTEIN: CRP: 19.6 mg/dL — ABNORMAL HIGH

## 2020-08-18 LAB — HIV ANTIBODY (ROUTINE TESTING W REFLEX): HIV Screen 4th Generation wRfx: NONREACTIVE

## 2020-08-18 LAB — D-DIMER, QUANTITATIVE: D-Dimer, Quant: 0.88 ug{FEU}/mL — ABNORMAL HIGH (ref 0.00–0.50)

## 2020-08-18 MED ORDER — ENOXAPARIN SODIUM 60 MG/0.6ML ~~LOC~~ SOLN
55.0000 mg | SUBCUTANEOUS | Status: DC
  Administered 2020-08-18 – 2020-08-20 (×3): 55 mg via SUBCUTANEOUS
  Filled 2020-08-18 (×3): qty 0.6

## 2020-08-18 MED ORDER — METHYLPREDNISOLONE SODIUM SUCC 125 MG IJ SOLR
50.0000 mg | Freq: Two times a day (BID) | INTRAMUSCULAR | Status: DC
  Administered 2020-08-18 – 2020-08-21 (×7): 50 mg via INTRAVENOUS
  Filled 2020-08-18 (×7): qty 2

## 2020-08-18 NOTE — Progress Notes (Signed)
PROGRESS NOTE                                                                                                                                                                                                             Patient Demographics:    Jerry Woods, is a 44 y.o. male, DOB - [birth year], YHC:[id number]  Outpatient Primary MD for the patient is Katherina Mires, MD   Admit date - 08/17/2020   LOS - 1  Chief Complaint  Patient presents with  . Covid Symptoms    + COVID       Brief Narrative: Patient is a 44 y.o. male with PMHx of bipolar disorder-who presented with cough, shortness of breath-found to have acute hypoxic respiratory failure due to COVID-19 pneumonia.  COVID-19 vaccinated status: Vaccinated  Significant Events: 10/29>> Admit to [facility] for hypoxia due to COVID-19 pneumonia  Significant studies: 10/29>>Chest x-ray: Diffuse bilateral pulmonary infiltrates  COVID-19 medications: Steroids: 10/29>> Remdesivir: 10/29>>  Antibiotics: Levofloxacin: 10/29>>  Microbiology data: 10/29 >>blood culture: Pending  Procedures: None  Consults: None  DVT prophylaxis: enoxaparin (LOVENOX) injection 40 mg Start: 08/17/20 2200    Subjective:    Jerry Woods today remains unchanged-claims cough is somewhat better.   Assessment  & Plan :   Acute Hypoxic Resp Failure due to Covid 19 Viral pneumonia with probable superimposed bacterial pneumonia: Appears to have mild hypoxia-plans are to continue steroids/remdesivir and empiric levofloxacin.  Follow clinical course-if hypoxia worsens-we can consider adding Actemra or baricitinib.  Fever: afebrile O2 requirements:  SpO2: 90 % O2 Flow Rate (L/min): 2 L/min   COVID-19 Labs: Recent Labs    08/17/20 1329 08/18/20 0157  DDIMER 1.14* 0.88*  FERRITIN 1,508*  --   LDH 442*  --   CRP 18.6* 19.6*    No results found for: BNP  Recent Labs  Lab  08/17/20 1329  PROCALCITON 1.30    Lab Results  Component Value Date   SARSCOV2NAA POSITIVE (A) 08/17/2020    Prone/Incentive Spirometry: encouraged  incentive spirometry use 3-4/hour.  Bipolar disorder: Stable-Per patient-he has not taken any medications including Lamictal/Seroquel for more than 6 months.  He is not interested in any medications at this point.  Follow with PCP/primary psychiatrist in the outpatient setting.   Obesity: Estimated body mass index is 34.51 kg/m as calculated from the  following:   Height as of this encounter: 5' 10"  (1.778 m).   Weight as of this encounter: 109.1 kg.    ABG: No results found for: PHART, PCO2ART, PO2ART, HCO3, TCO2, ACIDBASEDEF, O2SAT  Vent Settings: N/A    Condition -Stable  Family Communication  : Patient prefers to update family himself-I have asked him to let me know if he wants me to talk to his family.  Code Status :  Full Code  Diet :  Diet Order            Diet regular Room service appropriate? Yes; Fluid consistency: Thin  Diet effective now                  Disposition Plan  :   Status is: Inpatient  Remains inpatient appropriate because:Inpatient level of care appropriate due to severity of illness   Dispo: The patient is from: Home              Anticipated d/c is to: Home              Anticipated d/c date is: 2 days              Patient currently is not medically stable to d/c.  Barriers to discharge: Hypoxia requiring O2 supplementation/complete 5 days of IV Remdesivir  Antimicorbials  :    Anti-infectives (From admission, onward)   Start     Dose/Rate Route Frequency Ordered Stop   08/18/20 1000  remdesivir 100 mg in sodium chloride 0.9 % 100 mL IVPB       "Followed by" Linked Group Details   100 mg 200 mL/hr over 30 Minutes Intravenous Daily 08/17/20 1859 08/22/20 0959   08/17/20 2130  levofloxacin (LEVAQUIN) IVPB 750 mg        750 mg 100 mL/hr over 90 Minutes Intravenous Every 24 hours  08/17/20 2038     08/17/20 2045  cefTRIAXone (ROCEPHIN) 2 g in sodium chloride 0.9 % 100 mL IVPB  Status:  Discontinued        2 g 200 mL/hr over 30 Minutes Intravenous Every 24 hours 08/17/20 1955 08/17/20 2036   08/17/20 2045  azithromycin (ZITHROMAX) 500 mg in sodium chloride 0.9 % 250 mL IVPB  Status:  Discontinued        500 mg 250 mL/hr over 60 Minutes Intravenous Every 24 hours 08/17/20 1955 08/17/20 2036   08/17/20 2000  remdesivir 200 mg in sodium chloride 0.9% 250 mL IVPB       "Followed by" Linked Group Details   200 mg 580 mL/hr over 30 Minutes Intravenous Once 08/17/20 1859 08/17/20 2038      Inpatient Medications  Scheduled Meds: . vitamin C  500 mg Oral Daily  . enoxaparin (LOVENOX) injection  40 mg Subcutaneous Q24H  . influenza vac split quadrivalent PF  0.5 mL Intramuscular Tomorrow-1000  . methylPREDNISolone (SOLU-MEDROL) injection  50 mg Intravenous Q12H  . zinc sulfate  220 mg Oral Daily   Continuous Infusions: . lactated ringers Stopped (08/18/20 0913)  . levofloxacin (LEVAQUIN) IV Stopped (08/17/20 2339)  . remdesivir 100 mg in NS 100 mL 100 mg (08/18/20 0910)   PRN Meds:.acetaminophen, albuterol, guaiFENesin-dextromethorphan, ondansetron **OR** ondansetron (ZOFRAN) IV, polyethylene glycol   Time Spent in minutes  25   See all Orders from today for further details   Jerry Woods M.D on 08/18/2020 at 11:08 AM  To page go to [email or web address] - use universal password  [facility] -  Office  [phone]    Objective:   Vitals:   08/18/20 0049 08/18/20 0400 08/18/20 0500 08/18/20 0609  BP: (!) 146/95 139/82    Pulse: 90 93 86 88  Resp: (!) 23 20 (!) 28 20  Temp: 98.2 F (36.8 C) 98.1 F (36.7 C)    TempSrc: Oral Oral    SpO2: 91% (!) 89% 92% 90%  Weight:      Height:        Wt Readings from Last 3 Encounters:  08/17/20 109.1 kg  10/27/17 105.7 kg     Intake/Output Summary (Last 24 hours) at 08/18/2020 1108 Last data filed at  08/18/2020 1024 Gross per 24 hour  Intake 928.4 ml  Output 1400 ml  Net -471.6 ml     Physical Exam Gen Exam:Alert awake-not in any distress HEENT:atraumatic, normocephalic Chest: B/L clear to auscultation anteriorly CVS:S1S2 regular Abdomen:soft non tender, non distended Extremities:no edema Neurology: Non focal Skin: no rash   Data Review:    CBC Recent Labs  Lab 08/17/20 1329 08/18/20 0157  WBC 6.8 3.8*  HGB 15.0 14.2  HCT 43.1 41.4  PLT 205 210  MCV 85.5 85.9  MCH 29.8 29.5  MCHC 34.8 34.3  RDW 13.9 13.8  LYMPHSABS 0.9 0.6*  MONOABS 0.2 0.1  EOSABS 0.0 0.0  BASOSABS 0.0 0.0    Chemistries  Recent Labs  Lab 08/17/20 1329 08/18/20 0157  NA 135 137  K 3.7 4.0  CL 97* 102  CO2 23 24  GLUCOSE 109* 148*  BUN 15 13  CREATININE 0.87 0.86  CALCIUM 8.2* 8.4*  MG  --  2.1  AST 88* 75*  ALT 53* 51*  ALKPHOS 48 49  BILITOT 0.7 0.4   ------------------------------------------------------------------------------------------------------------------ Recent Labs    08/17/20 1329  TRIG 136    No results found for: HGBA1C ------------------------------------------------------------------------------------------------------------------ No results for input(s): TSH, T4TOTAL, T3FREE, THYROIDAB in the last 72 hours.  Invalid input(s): FREET3 ------------------------------------------------------------------------------------------------------------------ Recent Labs    08/17/20 1329  FERRITIN 1,508*    Coagulation profile No results for input(s): INR, PROTIME in the last 168 hours.  Recent Labs    08/17/20 1329 08/18/20 0157  DDIMER 1.14* 0.88*    Cardiac Enzymes No results for input(s): CKMB, TROPONINI, MYOGLOBIN in the last 168 hours.  Invalid input(s): CK ------------------------------------------------------------------------------------------------------------------ No results found for: BNP  Micro Results Recent Results (from the past  240 hour(s))  Respiratory Panel by RT PCR (Flu A&B, Covid) - Nasopharyngeal Swab     Status: Abnormal   Collection Time: 08/17/20 12:39 PM   Specimen: Nasopharyngeal Swab  Result Value Ref Range Status   SARS Coronavirus 2 by RT PCR POSITIVE (A) NEGATIVE Final    Comment: RESULT CALLED TO, READ BACK BY AND VERIFIED WITH: [place] [id number] [id number] PHILLIPS C (NOTE) SARS-CoV-2 target nucleic acids are DETECTED.  SARS-CoV-2 RNA is generally detectable in upper respiratory specimens  during the acute phase of infection. Positive results are indicative of the presence of the identified virus, but do not rule out bacterial infection or co-infection with other pathogens not detected by the test. Clinical correlation with patient history and other diagnostic information is necessary to determine patient infection status. The expected result is Negative.  Fact Sheet for Patients:  PinkCheek.be  Fact Sheet for Healthcare Providers: GravelBags.it  This test is not yet approved or cleared by the [place] FDA and  has been authorized for detection and/or diagnosis of SARS-CoV-2 by FDA under an Emergency Use Authorization (EUA).  This EUA will remain in effect (meaning this test can  be used) for the duration of  the COVID-19 declaration under Section 564(b)(1) of the Act, 21 U.S.C. section 360bbb-3(b)(1), unless the authorization is terminated or revoked sooner.      Influenza A by PCR NEGATIVE NEGATIVE Final   Influenza B by PCR NEGATIVE NEGATIVE Final    Comment: (NOTE) The Xpert Xpress SARS-CoV-2/FLU/RSV assay is intended as an aid in  the diagnosis of influenza from Nasopharyngeal swab specimens and  should not be used as a sole basis for treatment. Nasal washings and  aspirates are unacceptable for Xpert Xpress SARS-CoV-2/FLU/RSV  testing.  Fact Sheet for Patients: PinkCheek.be  Fact  Sheet for Healthcare Providers: GravelBags.it  This test is not yet approved or cleared by the [place] FDA and  has been authorized for detection and/or diagnosis of SARS-CoV-2 by  FDA under an Emergency Use Authorization (EUA). This EUA will remain  in effect (meaning this test can be used) for the duration of the  Covid-19 declaration under Section 564(b)(1) of the Act, 21  U.S.C. section 360bbb-3(b)(1), unless the authorization is  terminated or revoked. Performed at [facility], [address] [place], [place] [address]     Radiology Reports DG Chest [place] 1 View  Result Date: 08/17/2020 CLINICAL DATA:  COVID symptoms.  COVID exposure. EXAM: PORTABLE CHEST 1 VIEW COMPARISON:  10/27/2017. FINDINGS: Heart size normal. Low lung volumes with diffuse prominent bilateral pulmonary infiltrates. COVID pneumonia could present in this fashion. No pleural effusion or pneumothorax. IMPRESSION: Low lung volumes with diffuse prominent bilateral pulmonary infiltrates. COVID pneumonia could present in this fashion. Electronically Signed   By: Marcello Moores  Register   On: 08/17/2020 13:32

## 2020-08-18 NOTE — Progress Notes (Signed)
OK to increase lovenox to 85m SQ qday per Dr. GSloan Leiter  MOnnie Boer PharmD, BCIDP, AAHIVP, CPP Infectious Disease Pharmacist 08/18/2020 2:46 PM

## 2020-08-19 LAB — CBC WITH DIFFERENTIAL/PLATELET
Abs Immature Granulocytes: 0.12 10*3/uL — ABNORMAL HIGH (ref 0.00–0.07)
Basophils Absolute: 0 10*3/uL (ref 0.0–0.1)
Basophils Relative: 0 %
Eosinophils Absolute: 0 10*3/uL (ref 0.0–0.5)
Eosinophils Relative: 0 %
HCT: 40 % (ref 39.0–52.0)
Hemoglobin: 13.6 g/dL (ref 13.0–17.0)
Immature Granulocytes: 1 %
Lymphocytes Relative: 10 %
Lymphs Abs: 1 10*3/uL (ref 0.7–4.0)
MCH: 29.6 pg (ref 26.0–34.0)
MCHC: 34 g/dL (ref 30.0–36.0)
MCV: 87.1 fL (ref 80.0–100.0)
Monocytes Absolute: 0.6 10*3/uL (ref 0.1–1.0)
Monocytes Relative: 5 %
Neutro Abs: 8.7 10*3/uL — ABNORMAL HIGH (ref 1.7–7.7)
Neutrophils Relative %: 84 %
Platelets: 268 10*3/uL (ref 150–400)
RBC: 4.59 MIL/uL (ref 4.22–5.81)
RDW: 13.9 % (ref 11.5–15.5)
WBC: 10.4 10*3/uL (ref 4.0–10.5)
nRBC: 0 % (ref 0.0–0.2)

## 2020-08-19 LAB — COMPREHENSIVE METABOLIC PANEL
ALT: 55 U/L — ABNORMAL HIGH (ref 0–44)
AST: 67 U/L — ABNORMAL HIGH (ref 15–41)
Albumin: 2.5 g/dL — ABNORMAL LOW (ref 3.5–5.0)
Alkaline Phosphatase: 46 U/L (ref 38–126)
Anion gap: 8 (ref 5–15)
BUN: 20 mg/dL (ref 6–20)
CO2: 26 mmol/L (ref 22–32)
Calcium: 8.5 mg/dL — ABNORMAL LOW (ref 8.9–10.3)
Chloride: 105 mmol/L (ref 98–111)
Creatinine, Ser: 0.9 mg/dL (ref 0.61–1.24)
GFR, Estimated: >60 mL/min (ref >60)
Glucose, Bld: 157 mg/dL — ABNORMAL HIGH (ref 70–99)
Potassium: 4.3 mmol/L (ref 3.5–5.1)
Sodium: 139 mmol/L (ref 135–145)
Total Bilirubin: 0.5 mg/dL (ref 0.3–1.2)
Total Protein: 5.9 g/dL — ABNORMAL LOW (ref 6.5–8.1)

## 2020-08-19 LAB — MAGNESIUM: Magnesium: 2 mg/dL (ref 1.7–2.4)

## 2020-08-19 LAB — C-REACTIVE PROTEIN: CRP: 8.2 mg/dL — ABNORMAL HIGH (ref <1.0)

## 2020-08-19 LAB — D-DIMER, QUANTITATIVE: D-Dimer, Quant: 0.59 ug/mL-FEU — ABNORMAL HIGH (ref 0.00–0.50)

## 2020-08-19 MED ORDER — ALBUTEROL SULFATE HFA 108 (90 BASE) MCG/ACT IN AERS
2.0000 | INHALATION_SPRAY | RESPIRATORY_TRACT | Status: DC | PRN
  Filled 2020-08-19: qty 6.7

## 2020-08-19 MED ORDER — BENZONATATE 100 MG PO CAPS
200.0000 mg | ORAL_CAPSULE | Freq: Three times a day (TID) | ORAL | Status: DC
  Administered 2020-08-19 – 2020-08-21 (×6): 200 mg via ORAL
  Filled 2020-08-19 (×6): qty 2

## 2020-08-19 MED ORDER — HYDROCOD POLST-CPM POLST ER 10-8 MG/5ML PO SUER: 5.0000 mL | Freq: Two times a day (BID) | ORAL | Status: DC | PRN

## 2020-08-19 MED ORDER — ALBUTEROL SULFATE HFA 108 (90 BASE) MCG/ACT IN AERS
2.0000 | INHALATION_SPRAY | Freq: Four times a day (QID) | RESPIRATORY_TRACT | Status: DC
  Administered 2020-08-19 – 2020-08-21 (×6): 2 via RESPIRATORY_TRACT
  Filled 2020-08-19 (×8): qty 6.7

## 2020-08-19 NOTE — Progress Notes (Signed)
PROGRESS NOTE                                                                                                                                                                                                             Patient Demographics:    Jerry Woods, is a 44 y.o. male, DOB - [birth year], DHR:[id number]  Outpatient Primary MD for the patient is Katherina Mires, MD   Admit date - 08/17/2020   LOS - 2  Chief Complaint  Patient presents with  . Covid Symptoms    + COVID       Brief Narrative: Patient is a 44 y.o. male with PMHx of bipolar disorder-who presented with cough, shortness of breath-found to have acute hypoxic respiratory failure due to COVID-19 pneumonia.  COVID-19 vaccinated status: Vaccinated  Significant Events: 10/29>> Admit to [facility] for hypoxia due to COVID-19 pneumonia  Significant studies: 10/29>>Chest x-ray: Diffuse bilateral pulmonary infiltrates  COVID-19 medications: Steroids: 10/29>> Remdesivir: 10/29>>  Antibiotics: Levofloxacin: 10/29>>  Microbiology data: 10/29 >>blood culture: Pending  Procedures: None  Consults: None  DVT prophylaxis: Prophylactic Lovenox    Subjective:   Continues to have coughing spells have been stable on 2 L.   Assessment  & Plan :   Acute Hypoxic Resp Failure due to Covid 19 Viral pneumonia with probable superimposed bacterial pneumonia: Appears unchanged-continues to have coughing spells-CRP downtrending-stable on just 2 L of oxygen-continue steroids/Remdesivir and empiric levofloxacin.  Follow clinical course-if hypoxia worsens-we can consider adding Actemra or baricitinib.   Fever: afebrile O2 requirements:  SpO2: 90 % O2 Flow Rate (L/min): 2 L/min   COVID-19 Labs: Recent Labs    08/17/20 1329 08/18/20 0157 08/19/20 0340  DDIMER 1.14* 0.88* 0.59*  FERRITIN 1,508*  --   --   LDH 442*  --   --   CRP 18.6* 19.6* 8.2*    No results  found for: BNP  Recent Labs  Lab 08/17/20 1329  PROCALCITON 1.30    Lab Results  Component Value Date   SARSCOV2NAA POSITIVE (A) 08/17/2020    Prone/Incentive Spirometry: encouraged  incentive spirometry use 3-4/hour.  Transaminitis: Related to COVID-19-mild-stable for follow-up without any further work-up.  Bipolar disorder: Stable-Per patient-he has not taken any medications including Lamictal/Seroquel for more than 6 months.  He is not interested in any medications at this point.  Follow with PCP/primary psychiatrist in the outpatient setting.   Obesity: Estimated body mass index is 34.51 kg/m as calculated from the following:   Height as of this encounter: 5' 10"  (1.778 m).   Weight as of this encounter: 109.1 kg.    ABG: No results found for: PHART, PCO2ART, PO2ART, HCO3, TCO2, ACIDBASEDEF, O2SAT  Vent Settings: N/A    Condition -Stable  Family Communication  : Patient prefers to update family himself-I have asked him to let me know if he wants me to talk to his family.  Code Status :  Full Code  Diet :  Diet Order            Diet regular Room service appropriate? Yes; Fluid consistency: Thin  Diet effective now                  Disposition Plan  :   Status is: Inpatient  Remains inpatient appropriate because:Inpatient level of care appropriate due to severity of illness   Dispo: The patient is from: Home              Anticipated d/c is to: Home              Anticipated d/c date is: 2 days              Patient currently is not medically stable to d/c.  Barriers to discharge: Hypoxia requiring O2 supplementation/complete 5 days of IV Remdesivir  Antimicorbials  :    Anti-infectives (From admission, onward)   Start     Dose/Rate Route Frequency Ordered Stop   08/18/20 1000  remdesivir 100 mg in sodium chloride 0.9 % 100 mL IVPB       "Followed by" Linked Group Details   100 mg 200 mL/hr over 30 Minutes Intravenous Daily 08/17/20 1859 08/22/20 0959    08/17/20 2130  levofloxacin (LEVAQUIN) IVPB 750 mg        750 mg 100 mL/hr over 90 Minutes Intravenous Every 24 hours 08/17/20 2038     08/17/20 2045  cefTRIAXone (ROCEPHIN) 2 g in sodium chloride 0.9 % 100 mL IVPB  Status:  Discontinued        2 g 200 mL/hr over 30 Minutes Intravenous Every 24 hours 08/17/20 1955 08/17/20 2036   08/17/20 2045  azithromycin (ZITHROMAX) 500 mg in sodium chloride 0.9 % 250 mL IVPB  Status:  Discontinued        500 mg 250 mL/hr over 60 Minutes Intravenous Every 24 hours 08/17/20 1955 08/17/20 2036   08/17/20 2000  remdesivir 200 mg in sodium chloride 0.9% 250 mL IVPB       "Followed by" Linked Group Details   200 mg 580 mL/hr over 30 Minutes Intravenous Once 08/17/20 1859 08/17/20 2038      Inpatient Medications  Scheduled Meds: . vitamin C  500 mg Oral Daily  . enoxaparin (LOVENOX) injection  55 mg Subcutaneous Q24H  . influenza vac split quadrivalent PF  0.5 mL Intramuscular Tomorrow-1000  . methylPREDNISolone (SOLU-MEDROL) injection  50 mg Intravenous Q12H  . zinc sulfate  220 mg Oral Daily   Continuous Infusions: . levofloxacin (LEVAQUIN) IV 750 mg (08/18/20 2044)  . remdesivir 100 mg in NS 100 mL 100 mg (08/19/20 0945)   PRN Meds:.acetaminophen, albuterol, guaiFENesin-dextromethorphan, ondansetron **OR** ondansetron (ZOFRAN) IV, polyethylene glycol   Time Spent in minutes  25   See all Orders from today for further details   Oren Binet M.D on 08/19/2020 at 11:24 AM  To page go to [email or web address] - use universal password  [facility] -  Office  [phone]    Objective:   Vitals:   08/18/20 0609 08/18/20 1418 08/18/20 2045 08/19/20 0542  BP:  129/86 128/84 (!) 139/93  Pulse: 88 97 100 92  Resp: 20 (!) 21 18 (!) 21  Temp:  97.7 F (36.5 C) 97.8 F (36.6 C) 97.8 F (36.6 C)  TempSrc:  Oral Axillary Axillary  SpO2: 90% 90% 90% 90%  Weight:      Height:        Wt Readings from Last 3 Encounters:  08/17/20 109.1  kg  10/27/17 105.7 kg     Intake/Output Summary (Last 24 hours) at 08/19/2020 1124 Last data filed at 08/19/2020 0650 Gross per 24 hour  Intake 240 ml  Output 1815 ml  Net -1575 ml     Physical Exam Gen Exam:Alert awake-not in any distress HEENT:atraumatic, normocephalic Chest: B/L clear to auscultation anteriorly CVS:S1S2 regular Abdomen:soft non tender, non distended Extremities:no edema Neurology: Non focal Skin: no rash   Data Review:    CBC Recent Labs  Lab 08/17/20 1329 08/18/20 0157 08/19/20 0340  WBC 6.8 3.8* 10.4  HGB 15.0 14.2 13.6  HCT 43.1 41.4 40.0  PLT 205 210 268  MCV 85.5 85.9 87.1  MCH 29.8 29.5 29.6  MCHC 34.8 34.3 34.0  RDW 13.9 13.8 13.9  LYMPHSABS 0.9 0.6* 1.0  MONOABS 0.2 0.1 0.6  EOSABS 0.0 0.0 0.0  BASOSABS 0.0 0.0 0.0    Chemistries  Recent Labs  Lab 08/17/20 1329 08/18/20 0157 08/19/20 0340  NA 135 137 139  K 3.7 4.0 4.3  CL 97* 102 105  CO2 23 24 26   GLUCOSE 109* 148* 157*  BUN 15 13 20   CREATININE 0.87 0.86 0.90  CALCIUM 8.2* 8.4* 8.5*  MG  --  2.1 2.0  AST 88* 75* 67*  ALT 53* 51* 55*  ALKPHOS 48 49 46  BILITOT 0.7 0.4 0.5   ------------------------------------------------------------------------------------------------------------------ Recent Labs    08/17/20 1329  TRIG 136    No results found for: HGBA1C ------------------------------------------------------------------------------------------------------------------ No results for input(s): TSH, T4TOTAL, T3FREE, THYROIDAB in the last 72 hours.  Invalid input(s): FREET3 ------------------------------------------------------------------------------------------------------------------ Recent Labs    08/17/20 1329  FERRITIN 1,508*    Coagulation profile No results for input(s): INR, PROTIME in the last 168 hours.  Recent Labs    08/18/20 0157 08/19/20 0340  DDIMER 0.88* 0.59*    Cardiac Enzymes No results for input(s): CKMB, TROPONINI,  MYOGLOBIN in the last 168 hours.  Invalid input(s): CK ------------------------------------------------------------------------------------------------------------------ No results found for: BNP  Micro Results Recent Results (from the past 240 hour(s))  Respiratory Panel by RT PCR (Flu A&B, Covid) - Nasopharyngeal Swab     Status: Abnormal   Collection Time: 08/17/20 12:39 PM   Specimen: Nasopharyngeal Swab  Result Value Ref Range Status   SARS Coronavirus 2 by RT PCR POSITIVE (A) NEGATIVE Final    Comment: RESULT CALLED TO, READ BACK BY AND VERIFIED WITH: [place] [id number] PHILLIPS C (NOTE) SARS-CoV-2 target nucleic acids are DETECTED.  SARS-CoV-2 RNA is generally detectable in upper respiratory specimens  during the acute phase of infection. Positive results are indicative of the presence of the identified virus, but do not rule out bacterial infection or co-infection with other pathogens not detected by the test. Clinical correlation with patient history and other diagnostic information is necessary to determine patient infection status. The expected result is Negative.  Fact Sheet for Patients:  PinkCheek.be  Fact Sheet for Healthcare Providers: GravelBags.it  This test is not yet approved or cleared by the [place] FDA and  has been authorized for detection and/or diagnosis of SARS-CoV-2 by FDA under an Emergency Use Authorization (EUA).  This EUA will remain in effect (meaning this test can  be used) for the duration of  the COVID-19 declaration under Section 564(b)(1) of the Act, 21 U.S.C. section 360bbb-3(b)(1), unless the authorization is terminated or revoked sooner.      Influenza A by PCR NEGATIVE NEGATIVE Final   Influenza B by PCR NEGATIVE NEGATIVE Final    Comment: (NOTE) The Xpert Xpress SARS-CoV-2/FLU/RSV assay is intended as an aid in  the diagnosis of influenza from Nasopharyngeal  swab specimens and  should not be used as a sole basis for treatment. Nasal washings and  aspirates are unacceptable for Xpert Xpress SARS-CoV-2/FLU/RSV  testing.  Fact Sheet for Patients: PinkCheek.be  Fact Sheet for Healthcare Providers: GravelBags.it  This test is not yet approved or cleared by the [place] FDA and  has been authorized for detection and/or diagnosis of SARS-CoV-2 by  FDA under an Emergency Use Authorization (EUA). This EUA will remain  in effect (meaning this test can be used) for the duration of the  Covid-19 declaration under Section 564(b)(1) of the Act, 21  U.S.C. section 360bbb-3(b)(1), unless the authorization is  terminated or revoked. Performed at [facility], [address] [place], [place] [address]   Blood Culture (routine x 2)     Status: None (Preliminary result)   Collection Time: 08/17/20  1:28 PM   Specimen: BLOOD  Result Value Ref Range Status   Specimen Description   Final    BLOOD RIGHT ANTECUBITAL Performed at [facility], [address] [place], [place] [address]    Special Requests   Final    BOTTLES DRAWN AEROBIC AND ANAEROBIC Blood Culture adequate volume Performed at [facility], [address] [place], [place] [address]    Culture   Final    NO GROWTH 2 DAYS Performed at [facility], [place] [address], [place], [place] [address]    Report Status PENDING  Incomplete  Blood Culture (routine x 2)     Status: None (Preliminary result)   Collection Time: 08/17/20  1:35 PM   Specimen: BLOOD  Result Value Ref Range Status   Specimen Description   Final    BLOOD RIGHT FOREARM Performed at [facility], [address] [place], [place] [address]    Special Requests   Final    BOTTLES DRAWN AEROBIC AND ANAEROBIC Blood Culture adequate volume Performed at [facility], [address] [place], [place]  [address]    Culture   Final    NO GROWTH 2 DAYS Performed at [facility], [place] [address], [place], [place] [address]    Report Status PENDING  Incomplete    Radiology Reports DG Chest [place] 1 View  Result Date: 08/17/2020 CLINICAL DATA:  COVID symptoms.  COVID exposure. EXAM: PORTABLE CHEST 1 VIEW COMPARISON:  10/27/2017. FINDINGS: Heart size normal. Low lung volumes with diffuse prominent bilateral pulmonary infiltrates. COVID pneumonia could present in this fashion. No pleural effusion or pneumothorax. IMPRESSION: Low lung volumes with diffuse prominent bilateral pulmonary infiltrates. COVID pneumonia could present in this fashion. Electronically Signed   By: Marcello Moores  Register   On:  08/17/2020 13:32    

## 2020-08-20 LAB — CBC WITH DIFFERENTIAL/PLATELET
Abs Immature Granulocytes: 0.27 K/uL — ABNORMAL HIGH (ref 0.00–0.07)
Basophils Absolute: 0 K/uL (ref 0.0–0.1)
Basophils Relative: 0 %
Eosinophils Absolute: 0 K/uL (ref 0.0–0.5)
Eosinophils Relative: 0 %
HCT: 40.6 % (ref 39.0–52.0)
Hemoglobin: 13.5 g/dL (ref 13.0–17.0)
Immature Granulocytes: 2 %
Lymphocytes Relative: 10 %
Lymphs Abs: 1.1 K/uL (ref 0.7–4.0)
MCH: 29.1 pg (ref 26.0–34.0)
MCHC: 33.3 g/dL (ref 30.0–36.0)
MCV: 87.5 fL (ref 80.0–100.0)
Monocytes Absolute: 0.5 K/uL (ref 0.1–1.0)
Monocytes Relative: 5 %
Neutro Abs: 9.2 K/uL — ABNORMAL HIGH (ref 1.7–7.7)
Neutrophils Relative %: 83 %
Platelets: 275 K/uL (ref 150–400)
RBC: 4.64 MIL/uL (ref 4.22–5.81)
RDW: 13.9 % (ref 11.5–15.5)
WBC: 11.1 K/uL — ABNORMAL HIGH (ref 4.0–10.5)
nRBC: 0 % (ref 0.0–0.2)

## 2020-08-20 LAB — COMPREHENSIVE METABOLIC PANEL
ALT: 110 U/L — ABNORMAL HIGH (ref 0–44)
AST: 95 U/L — ABNORMAL HIGH (ref 15–41)
Albumin: 2.4 g/dL — ABNORMAL LOW (ref 3.5–5.0)
Alkaline Phosphatase: 50 U/L (ref 38–126)
Anion gap: 7 (ref 5–15)
BUN: 19 mg/dL (ref 6–20)
CO2: 27 mmol/L (ref 22–32)
Calcium: 8.4 mg/dL — ABNORMAL LOW (ref 8.9–10.3)
Chloride: 105 mmol/L (ref 98–111)
Creatinine, Ser: 0.83 mg/dL (ref 0.61–1.24)
GFR, Estimated: >60 mL/min (ref >60)
Glucose, Bld: 160 mg/dL — ABNORMAL HIGH (ref 70–99)
Potassium: 4.4 mmol/L (ref 3.5–5.1)
Sodium: 139 mmol/L (ref 135–145)
Total Bilirubin: 0.8 mg/dL (ref 0.3–1.2)
Total Protein: 5.7 g/dL — ABNORMAL LOW (ref 6.5–8.1)

## 2020-08-20 LAB — MAGNESIUM: Magnesium: 2.1 mg/dL (ref 1.7–2.4)

## 2020-08-20 LAB — D-DIMER, QUANTITATIVE: D-Dimer, Quant: 0.47 ug{FEU}/mL (ref 0.00–0.50)

## 2020-08-20 LAB — PROCALCITONIN: Procalcitonin: 0.25 ng/mL

## 2020-08-20 LAB — C-REACTIVE PROTEIN: CRP: 3.4 mg/dL — ABNORMAL HIGH (ref <1.0)

## 2020-08-20 NOTE — Progress Notes (Signed)
PROGRESS NOTE                                                                                                                                                                                                             Patient Demographics:    Jerry Woods, is a 44 y.o. male, DOB - 04-Jun-1976, SJG:[id number]  Outpatient Primary MD for the patient is Katherina Mires, MD   Admit date - 08/17/2020   LOS - 3  Chief Complaint  Patient presents with  . Covid Symptoms    + COVID       Brief Narrative: Patient is a 44 y.o. male with PMHx of bipolar disorder-who presented with cough, shortness of breath-found to have acute hypoxic respiratory failure due to COVID-19 pneumonia.  COVID-19 vaccinated status: Vaccinated  Significant Events: 10/29>> Admit to [facility] for hypoxia due to COVID-19 pneumonia  Significant studies: 10/29>>Chest x-ray: Diffuse bilateral pulmonary infiltrates  COVID-19 medications: Steroids: 10/29>> Remdesivir: 10/29>>  Antibiotics: Levofloxacin: 10/29>>  Microbiology data: 10/29 >>blood culture: Pending  Procedures: None  Consults: None  DVT prophylaxis: Prophylactic Lovenox    Subjective:   Feels better-cough is finally somewhat better controlled.   Assessment  & Plan :   Acute Hypoxic Resp Failure due to Covid 19 Viral pneumonia with probable superimposed bacterial pneumonia: Slowly improving-coughing spells have improved-CRP downtrending-remains on just 2 L of oxygen-plan is to see if we can get him off oxygen-May need ambulatory O2 sat evaluation.  Continue steroids/Remdesivir.  If clinical improvement continues-suspect home on 11/2.   Fever: afebrile O2 requirements:  SpO2: 91 % O2 Flow Rate (L/min): 2 L/min   COVID-19 Labs: Recent Labs    08/18/20 0157 08/19/20 0340 08/20/20 0433  DDIMER 0.88* 0.59* 0.47  CRP 19.6* 8.2* 3.4*    No results found for: BNP  Recent Labs    Lab 08/17/20 1329 08/20/20 0433  PROCALCITON 1.30 0.25    Lab Results  Component Value Date   SARSCOV2NAA POSITIVE (A) 08/17/2020    Prone/Incentive Spirometry: encouraged  incentive spirometry use 3-4/hour.  Transaminitis: Related to COVID-19-mild-stable for follow-up without any further work-up.  Bipolar disorder: Stable-Per patient-he has not taken any medications including Lamictal/Seroquel for more than 6 months.  He is not interested in any medications at this point.  Follow with PCP/primary psychiatrist in the outpatient setting.  Obesity: Estimated body mass index is 34.51 kg/m as calculated from the following:   Height as of this encounter: 5' 10"  (1.778 m).   Weight as of this encounter: 109.1 kg.    ABG: No results found for: PHART, PCO2ART, PO2ART, HCO3, TCO2, ACIDBASEDEF, O2SAT  Vent Settings: N/A    Condition -Stable  Family Communication  : Patient prefers to update family himself-I have asked him to let me know if he wants me to talk to his family.  Code Status :  Full Code  Diet :  Diet Order            Diet regular Room service appropriate? Yes; Fluid consistency: Thin  Diet effective now                  Disposition Plan  :   Status is: Inpatient  Remains inpatient appropriate because:Inpatient level of care appropriate due to severity of illness   Dispo: The patient is from: Home              Anticipated d/c is to: Home              Anticipated d/c date is: 2 days              Patient currently is not medically stable to d/c.  Barriers to discharge: Hypoxia requiring O2 supplementation/complete 5 days of IV Remdesivir  Antimicorbials  :    Anti-infectives (From admission, onward)   Start     Dose/Rate Route Frequency Ordered Stop   08/18/20 1000  remdesivir 100 mg in sodium chloride 0.9 % 100 mL IVPB       "Followed by" Linked Group Details   100 mg 200 mL/hr over 30 Minutes Intravenous Daily 08/17/20 1859 08/22/20 0959    08/17/20 2130  levofloxacin (LEVAQUIN) IVPB 750 mg        750 mg 100 mL/hr over 90 Minutes Intravenous Every 24 hours 08/17/20 2038 08/21/20 2359   08/17/20 2045  cefTRIAXone (ROCEPHIN) 2 g in sodium chloride 0.9 % 100 mL IVPB  Status:  Discontinued        2 g 200 mL/hr over 30 Minutes Intravenous Every 24 hours 08/17/20 1955 08/17/20 2036   08/17/20 2045  azithromycin (ZITHROMAX) 500 mg in sodium chloride 0.9 % 250 mL IVPB  Status:  Discontinued        500 mg 250 mL/hr over 60 Minutes Intravenous Every 24 hours 08/17/20 1955 08/17/20 2036   08/17/20 2000  remdesivir 200 mg in sodium chloride 0.9% 250 mL IVPB       "Followed by" Linked Group Details   200 mg 580 mL/hr over 30 Minutes Intravenous Once 08/17/20 1859 08/17/20 2038      Inpatient Medications  Scheduled Meds: . albuterol  2 puff Inhalation Q6H  . vitamin C  500 mg Oral Daily  . benzonatate  200 mg Oral TID  . enoxaparin (LOVENOX) injection  55 mg Subcutaneous Q24H  . influenza vac split quadrivalent PF  0.5 mL Intramuscular Tomorrow-1000  . methylPREDNISolone (SOLU-MEDROL) injection  50 mg Intravenous Q12H  . zinc sulfate  220 mg Oral Daily   Continuous Infusions: . levofloxacin (LEVAQUIN) IV 750 mg (08/19/20 2146)  . remdesivir 100 mg in NS 100 mL 100 mg (08/20/20 1009)   PRN Meds:.acetaminophen, albuterol, chlorpheniramine-HYDROcodone, ondansetron **OR** ondansetron (ZOFRAN) IV, polyethylene glycol   Time Spent in minutes  25   See all Orders from today for further details   [employer]  M.D on 08/20/2020 at 1:51 PM  To page go to [email or web address] - use universal password  [facility] -  Office  [phone]    Objective:   Vitals:   08/19/20 0542 08/19/20 1751 08/19/20 2048 08/20/20 0502  BP: (!) 139/93 (!) 151/97 (!) 141/94 (!) 134/93  Pulse: 92 95 98 86  Resp: (!) 21 19 18 20   Temp: 97.8 F (36.6 C) 97.6 F (36.4 C) 97.8 F (36.6 C) 97.7 F (36.5 C)  TempSrc: Axillary Oral Oral Oral    SpO2: 90% 91% 92% 91%  Weight:      Height:        Wt Readings from Last 3 Encounters:  08/17/20 109.1 kg  10/27/17 105.7 kg     Intake/Output Summary (Last 24 hours) at 08/20/2020 1351 Last data filed at 08/20/2020 1000 Gross per 24 hour  Intake 480 ml  Output 1375 ml  Net -895 ml     Physical Exam Gen Exam:Alert awake-not in any distress HEENT:atraumatic, normocephalic Chest: B/L clear to auscultation anteriorly CVS:S1S2 regular Abdomen:soft non tender, non distended Extremities:no edema Neurology: Non focal Skin: no rash   Data Review:    CBC Recent Labs  Lab 08/17/20 1329 08/18/20 0157 08/19/20 0340 08/20/20 0433  WBC 6.8 3.8* 10.4 11.1*  HGB 15.0 14.2 13.6 13.5  HCT 43.1 41.4 40.0 40.6  PLT 205 210 268 275  MCV 85.5 85.9 87.1 87.5  MCH 29.8 29.5 29.6 29.1  MCHC 34.8 34.3 34.0 33.3  RDW 13.9 13.8 13.9 13.9  LYMPHSABS 0.9 0.6* 1.0 1.1  MONOABS 0.2 0.1 0.6 0.5  EOSABS 0.0 0.0 0.0 0.0  BASOSABS 0.0 0.0 0.0 0.0    Chemistries  Recent Labs  Lab 08/17/20 1329 08/18/20 0157 08/19/20 0340 08/20/20 0433  NA 135 137 139 139  K 3.7 4.0 4.3 4.4  CL 97* 102 105 105  CO2 23 24 26 27   GLUCOSE 109* 148* 157* 160*  BUN 15 13 20 19   CREATININE 0.87 0.86 0.90 0.83  CALCIUM 8.2* 8.4* 8.5* 8.4*  MG  --  2.1 2.0 2.1  AST 88* 75* 67* 95*  ALT 53* 51* 55* 110*  ALKPHOS 48 49 46 50  BILITOT 0.7 0.4 0.5 0.8   ------------------------------------------------------------------------------------------------------------------ No results for input(s): CHOL, HDL, LDLCALC, TRIG, CHOLHDL, LDLDIRECT in the last 72 hours.  No results found for: HGBA1C ------------------------------------------------------------------------------------------------------------------ No results for input(s): TSH, T4TOTAL, T3FREE, THYROIDAB in the last 72 hours.  Invalid input(s):  FREET3 ------------------------------------------------------------------------------------------------------------------ No results for input(s): VITAMINB12, FOLATE, FERRITIN, TIBC, IRON, RETICCTPCT in the last 72 hours.  Coagulation profile No results for input(s): INR, PROTIME in the last 168 hours.  Recent Labs    08/19/20 0340 08/20/20 0433  DDIMER 0.59* 0.47    Cardiac Enzymes No results for input(s): CKMB, TROPONINI, MYOGLOBIN in the last 168 hours.  Invalid input(s): CK ------------------------------------------------------------------------------------------------------------------ No results found for: BNP  Micro Results Recent Results (from the past 240 hour(s))  Respiratory Panel by RT PCR (Flu A&B, Covid) - Nasopharyngeal Swab     Status: Abnormal   Collection Time: 08/17/20 12:39 PM   Specimen: Nasopharyngeal Swab  Result Value Ref Range Status   SARS Coronavirus 2 by RT PCR POSITIVE (A) NEGATIVE Final    Comment: RESULT CALLED TO, READ BACK BY AND VERIFIED WITH: [place] WG [id number] PHILLIPS C (NOTE) SARS-CoV-2 target nucleic acids are DETECTED.  SARS-CoV-2 RNA is generally detectable in upper respiratory specimens  during the acute phase of infection. Positive  results are indicative of the presence of the identified virus, but do not rule out bacterial infection or co-infection with other pathogens not detected by the test. Clinical correlation with patient history and other diagnostic information is necessary to determine patient infection status. The expected result is Negative.  Fact Sheet for Patients:  PinkCheek.be  Fact Sheet for Healthcare Providers: GravelBags.it  This test is not yet approved or cleared by the [place] FDA and  has been authorized for detection and/or diagnosis of SARS-CoV-2 by FDA under an Emergency Use Authorization (EUA).  This EUA will remain in effect  (meaning this test can  be used) for the duration of  the COVID-19 declaration under Section 564(b)(1) of the Act, 21 U.S.C. section 360bbb-3(b)(1), unless the authorization is terminated or revoked sooner.      Influenza A by PCR NEGATIVE NEGATIVE Final   Influenza B by PCR NEGATIVE NEGATIVE Final    Comment: (NOTE) The Xpert Xpress SARS-CoV-2/FLU/RSV assay is intended as an aid in  the diagnosis of influenza from Nasopharyngeal swab specimens and  should not be used as a sole basis for treatment. Nasal washings and  aspirates are unacceptable for Xpert Xpress SARS-CoV-2/FLU/RSV  testing.  Fact Sheet for Patients: PinkCheek.be  Fact Sheet for Healthcare Providers: GravelBags.it  This test is not yet approved or cleared by the [place] FDA and  has been authorized for detection and/or diagnosis of SARS-CoV-2 by  FDA under an Emergency Use Authorization (EUA). This EUA will remain  in effect (meaning this test can be used) for the duration of the  Covid-19 declaration under Section 564(b)(1) of the Act, 21  U.S.C. section 360bbb-3(b)(1), unless the authorization is  terminated or revoked. Performed at [facility], [address] [place], [place] [address]   Blood Culture (routine x 2)     Status: None (Preliminary result)   Collection Time: 08/17/20  1:28 PM   Specimen: BLOOD  Result Value Ref Range Status   Specimen Description   Final    BLOOD RIGHT ANTECUBITAL Performed at [facility], [address] [place], [place] [address]    Special Requests   Final    BOTTLES DRAWN AEROBIC AND ANAEROBIC Blood Culture adequate volume Performed at [facility], [address] [place], [place] [address]    Culture   Final    NO GROWTH 3 DAYS Performed at [facility], [place] [address] [place], [place] [address]    Report Status PENDING  Incomplete  Blood Culture (routine x 2)      Status: None (Preliminary result)   Collection Time: 08/17/20  1:35 PM   Specimen: BLOOD  Result Value Ref Range Status   Specimen Description   Final    BLOOD RIGHT FOREARM Performed at [facility], [address] [place], [place] [address]    Special Requests   Final    BOTTLES DRAWN AEROBIC AND ANAEROBIC Blood Culture adequate volume Performed at [facility], [address] [place], [place] [address]    Culture   Final    NO GROWTH 3 DAYS Performed at [facility], [place] [address], [place], [place] [address]    Report Status PENDING  Incomplete    Radiology Reports DG Chest Port 1 View  Result Date: 08/17/2020 CLINICAL DATA:  COVID symptoms.  COVID exposure. EXAM: PORTABLE CHEST 1 VIEW COMPARISON:  10/27/2017. FINDINGS: Heart size normal.  Low lung volumes with diffuse prominent bilateral pulmonary infiltrates. COVID pneumonia could present in this fashion. No pleural effusion or pneumothorax. IMPRESSION: Low lung volumes with diffuse prominent bilateral pulmonary infiltrates. COVID pneumonia could present in this fashion. Electronically Signed   By: Marcello Moores  Register   On: 08/17/2020 13:32

## 2020-08-21 DIAGNOSIS — F319 Bipolar disorder, unspecified: Secondary | ICD-10-CM

## 2020-08-21 LAB — CBC WITH DIFFERENTIAL/PLATELET
Abs Immature Granulocytes: 0.49 10*3/uL — ABNORMAL HIGH (ref 0.00–0.07)
Basophils Absolute: 0.1 10*3/uL (ref 0.0–0.1)
Basophils Relative: 1 %
Eosinophils Absolute: 0 10*3/uL (ref 0.0–0.5)
Eosinophils Relative: 0 %
HCT: 41.3 % (ref 39.0–52.0)
Hemoglobin: 14.1 g/dL (ref 13.0–17.0)
Immature Granulocytes: 5 %
Lymphocytes Relative: 9 %
Lymphs Abs: 1 10*3/uL (ref 0.7–4.0)
MCH: 29.7 pg (ref 26.0–34.0)
MCHC: 34.1 g/dL (ref 30.0–36.0)
MCV: 87.1 fL (ref 80.0–100.0)
Monocytes Absolute: 0.5 10*3/uL (ref 0.1–1.0)
Monocytes Relative: 5 %
Neutro Abs: 8.1 10*3/uL — ABNORMAL HIGH (ref 1.7–7.7)
Neutrophils Relative %: 80 %
Platelets: 307 10*3/uL (ref 150–400)
RBC: 4.74 MIL/uL (ref 4.22–5.81)
RDW: 13.6 % (ref 11.5–15.5)
WBC: 10.2 10*3/uL (ref 4.0–10.5)
nRBC: 0 % (ref 0.0–0.2)

## 2020-08-21 LAB — COMPREHENSIVE METABOLIC PANEL
ALT: 136 U/L — ABNORMAL HIGH (ref 0–44)
AST: 75 U/L — ABNORMAL HIGH (ref 15–41)
Albumin: 2.5 g/dL — ABNORMAL LOW (ref 3.5–5.0)
Alkaline Phosphatase: 47 U/L (ref 38–126)
Anion gap: 11 (ref 5–15)
BUN: 18 mg/dL (ref 6–20)
CO2: 22 mmol/L (ref 22–32)
Calcium: 8.6 mg/dL — ABNORMAL LOW (ref 8.9–10.3)
Chloride: 103 mmol/L (ref 98–111)
Creatinine, Ser: 0.76 mg/dL (ref 0.61–1.24)
GFR, Estimated: >60 mL/min (ref >60)
Glucose, Bld: 204 mg/dL — ABNORMAL HIGH (ref 70–99)
Potassium: 4.4 mmol/L (ref 3.5–5.1)
Sodium: 136 mmol/L (ref 135–145)
Total Bilirubin: 0.4 mg/dL (ref 0.3–1.2)
Total Protein: 5.8 g/dL — ABNORMAL LOW (ref 6.5–8.1)

## 2020-08-21 LAB — C-REACTIVE PROTEIN: CRP: 1.9 mg/dL — ABNORMAL HIGH (ref <1.0)

## 2020-08-21 LAB — D-DIMER, QUANTITATIVE: D-Dimer, Quant: 0.5 ug/mL-FEU (ref 0.00–0.50)

## 2020-08-21 LAB — MAGNESIUM: Magnesium: 2.1 mg/dL (ref 1.7–2.4)

## 2020-08-21 MED ORDER — BENZONATATE 100 MG PO CAPS: 100.0000 mg | ORAL_CAPSULE | Freq: Four times a day (QID) | ORAL | 0 refills | Status: AC | PRN

## 2020-08-21 MED ORDER — ALBUTEROL SULFATE HFA 108 (90 BASE) MCG/ACT IN AERS: 2.0000 | INHALATION_SPRAY | Freq: Four times a day (QID) | RESPIRATORY_TRACT | 0 refills | Status: AC | PRN

## 2020-08-21 MED ORDER — PREDNISONE 10 MG PO TABS: ORAL_TABLET | ORAL | 0 refills | Status: AC

## 2020-08-21 MED ORDER — ALBUTEROL SULFATE HFA 108 (90 BASE) MCG/ACT IN AERS
2.0000 | INHALATION_SPRAY | Freq: Three times a day (TID) | RESPIRATORY_TRACT | Status: DC
  Administered 2020-08-21: 2 via RESPIRATORY_TRACT
  Filled 2020-08-21: qty 6.7

## 2020-08-21 NOTE — Discharge Summary (Signed)
PATIENT DETAILS Name: Jerry Woods Age: 44 y.o. Sex: male Date of Birth: [birth year] MRN: [id number]. Admitting Physician: Donnamae Jude, MD [id number], Jannifer Rodney, MD  Admit Date: 08/17/2020 Discharge date: 08/21/2020  Recommendations for Outpatient Follow-up:  1. Follow up with PCP in 1-2 weeks 2. Please obtain CMP/CBC in one week 3. Repeat Chest Xray in 4-6 week 4. Has stopped taking his psychiatric medications-suggest referral to psychiatrist.  Admitted From:  Home  Disposition: [place]: No  Equipment/Devices: None  Discharge Condition: Stable  CODE STATUS: FULL CODE  Diet recommendation:  Diet Order            Diet general           Diet regular Room service appropriate? Yes; Fluid consistency: Thin  Diet effective now                  Brief Narrative: Patient is a 44 y.o. male with PMHx of bipolar disorder-who presented with cough, shortness of breath-found to have acute hypoxic respiratory failure due to COVID-19 pneumonia.  COVID-19 vaccinated status: Vaccinated  Significant Events: 10/29>> [facility] to [facility] for hypoxia due to COVID-19 pneumonia  Significant studies: 10/29>>Chest x-ray: Diffuse bilateral pulmonary infiltrates  COVID-19 medications: Steroids: 10/29>> Remdesivir: 10/29>>11/2  Antibiotics: Levofloxacin: 10/29>>11/2  Microbiology data: 10/29 >>blood culture: No growth   Procedures: None  Consults: None  Brief Hospital Course: Acute Hypoxic Resp Failure due to Covid 19 Viral pneumonia with probable superimposed bacterial pneumonia:  Had mild hypoxemia-has been transitioned to room air-treated with steroids/Remdesivir and levofloxacin.  Significantly better-inflammatory markers downtrending-requesting discharge-suspect stable to be discharged today on tapering steroids.  Follow-up with PCP-suggest repeat two-view chest x-ray in 4 to 6 weeks to document resolution of pneumonia.  Patient counseled that if he has  worsening shortness of breath-he should seek immediate medical attention.    COVID-19 Labs:  Recent Labs    08/19/20 0340 08/20/20 0433 08/21/20 0202  DDIMER 0.59* 0.47 0.50  CRP 8.2* 3.4* 1.9*    Lab Results  Component Value Date   SARSCOV2NAA POSITIVE (A) 08/17/2020     Transaminitis: Related to COVID-19-mild-stable for follow-up without any further work-up.  Bipolar disorder: Stable-he does not appear to be manic or depressive-denies any suicidal/homicidal ideation-Per patient-he has not taken any medications including Lamictal/Seroquel for more than 6 months.  He is not interested in any medications at this point.  Follow with PCP/primary psychiatrist in the outpatient setting.   Obesity: Estimated body mass index is 34.51 kg/m as calculated from the following:   Height as of this encounter: 5' 10"  (1.778 m).   Weight as of this encounter: 109.1 kg.    Discharge Diagnoses:  Principal Problem:   COVID-19 virus infection Active Problems:   Bipolar disorder [facility])   Discharge Instructions:    Person Under Monitoring Name: Omarri Eich  Location: [place] [id number]   Infection Prevention Recommendations for Individuals Confirmed to have, or Being Evaluated for, 2019 Novel Coronavirus (COVID-19) Infection Who Receive Care at Home  Individuals who are confirmed to have, or are being evaluated for, COVID-19 should follow the prevention steps below until a healthcare provider or local or state health department says they can return to normal activities.  Stay home except to get medical care You should restrict activities outside your home, except for getting medical care. Do not go to work, school, or public areas, and do not use public transportation or taxis.  Call  ahead before visiting your doctor Before your medical appointment, call the healthcare provider and tell them that you have, or are being evaluated for, COVID-19  infection. This will help the healthcare provider's office take steps to keep other people from getting infected. Ask your healthcare provider to call the local or state health department.  Monitor your symptoms Seek prompt medical attention if your illness is worsening (e.g., difficulty breathing). Before going to your medical appointment, call the healthcare provider and tell them that you have, or are being evaluated for, COVID-19 infection. Ask your healthcare provider to call the local or state health department.  Wear a facemask You should wear a facemask that covers your nose and mouth when you are in the same room with other people and when you visit a healthcare provider. People who live with or visit you should also wear a facemask while they are in the same room with you.  Separate yourself from other people in your home As much as possible, you should stay in a different room from other people in your home. Also, you should use a separate bathroom, if available.  Avoid sharing household items You should not share dishes, drinking glasses, cups, eating utensils, towels, bedding, or other items with other people in your home. After using these items, you should wash them thoroughly with soap and water.  Cover your coughs and sneezes Cover your mouth and nose with a tissue when you cough or sneeze, or you can cough or sneeze into your sleeve. Throw used tissues in a lined trash can, and immediately wash your hands with soap and water for at least 20 seconds or use an alcohol-based hand rub.  Wash your [facility] your hands often and thoroughly with soap and water for at least 20 seconds. You can use an alcohol-based hand sanitizer if soap and water are not available and if your hands are not visibly dirty. Avoid touching your eyes, nose, and mouth with unwashed hands.   Prevention Steps for Caregivers and Household Members of Individuals Confirmed to have, or Being Evaluated  for, COVID-19 Infection Being Cared for in the Home  If you live with, or provide care at home for, a person confirmed to have, or being evaluated for, COVID-19 infection please follow these guidelines to prevent infection:  Follow healthcare provider's instructions Make sure that you understand and can help the patient follow any healthcare provider instructions for all care.  Provide for the patient's basic needs You should help the patient with basic needs in the home and provide support for getting groceries, prescriptions, and other personal needs.  Monitor the patient's symptoms If they are getting sicker, call his or her medical provider and tell them that the patient has, or is being evaluated for, COVID-19 infection. This will help the healthcare provider's office take steps to keep other people from getting infected. Ask the healthcare provider to call the local or state health department.  Limit the number of people who have contact with the patient  If possible, have only one caregiver for the patient.  Other household members should stay in another home or place of residence. If this is not possible, they should stay  in another room, or be separated from the patient as much as possible. Use a separate bathroom, if available.  Restrict visitors who do not have an essential need to be in the home.  Keep older adults, very young children, and other sick people away from the patient Keep  older adults, very young children, and those who have compromised immune systems or chronic health conditions away from the patient. This includes people with chronic heart, lung, or kidney conditions, diabetes, and cancer.  Ensure good ventilation Make sure that shared spaces in the home have good air flow, such as from an air conditioner or an opened window, weather permitting.  Wash your hands often  Wash your hands often and thoroughly with soap and water for at least 20 seconds. You  can use an alcohol based hand sanitizer if soap and water are not available and if your hands are not visibly dirty.  Avoid touching your eyes, nose, and mouth with unwashed hands.  Use disposable paper towels to dry your hands. If not available, use dedicated cloth towels and replace them when they become wet.  Wear a facemask and gloves  Wear a disposable facemask at all times in the room and gloves when you touch or have contact with the patient's blood, body fluids, and/or secretions or excretions, such as sweat, saliva, sputum, nasal mucus, vomit, urine, or feces.  Ensure the mask fits over your nose and mouth tightly, and do not touch it during use.  Throw out disposable facemasks and gloves after using them. Do not reuse.  Wash your hands immediately after removing your facemask and gloves.  If your personal clothing becomes contaminated, carefully remove clothing and launder. Wash your hands after handling contaminated clothing.  Place all used disposable facemasks, gloves, and other waste in a lined container before disposing them with other household waste.  Remove gloves and wash your hands immediately after handling these items.  Do not share dishes, glasses, or other household items with the patient  Avoid sharing household items. You should not share dishes, drinking glasses, cups, eating utensils, towels, bedding, or other items with a patient who is confirmed to have, or being evaluated for, COVID-19 infection.  After the person uses these items, you should wash them thoroughly with soap and water.  Wash laundry thoroughly  Immediately remove and wash clothes or bedding that have blood, body fluids, and/or secretions or excretions, such as sweat, saliva, sputum, nasal mucus, vomit, urine, or feces, on them.  Wear gloves when handling laundry from the patient.  Read and follow directions on labels of laundry or clothing items and detergent. In general, wash and dry with  the warmest temperatures recommended on the label.  Clean all areas the individual has used often  Clean all touchable surfaces, such as counters, tabletops, doorknobs, bathroom fixtures, toilets, phones, keyboards, tablets, and bedside tables, every day. Also, clean any surfaces that may have blood, body fluids, and/or secretions or excretions on them.  Wear gloves when cleaning surfaces the patient has come in contact with.  Use a diluted bleach solution (e.g., dilute bleach with 1 part bleach and 10 parts water) or a household disinfectant with a label that says EPA-registered for coronaviruses. To make a bleach solution at home, add 1 tablespoon of bleach to 1 quart (4 cups) of water. For a larger supply, add  cup of bleach to 1 gallon (16 cups) of water.  Read labels of cleaning products and follow recommendations provided on product labels. Labels contain instructions for safe and effective use of the cleaning product including precautions you should take when applying the product, such as wearing gloves or eye protection and making sure you have good ventilation during use of the product.  Remove gloves and wash hands immediately after cleaning.  Monitor yourself for signs and symptoms of illness Caregivers and household members are considered close contacts, should monitor their health, and will be asked to limit movement outside of the home to the extent possible. Follow the monitoring steps for close contacts listed on the symptom monitoring form.   ? If you have additional questions, contact your local health department or call the epidemiologist on call at [phone] (available 24/7). ? This guidance is subject to change. For the most up-to-date guidance from CDC, please refer to their website: YouBlogs.pl    Activity:  As tolerated  Discharge Instructions    Call MD for:  difficulty breathing, headache or  visual disturbances   Complete by: As directed    Diet general   Complete by: As directed    Discharge instructions   Complete by: As directed    1.)  21 days of isolation from the day of your first positive Covid test  2.)  If you develop worsening shortness of breath please seek immediate medical attention  3.)  Recommend that you follow-up with your primary psychiatrist-and resume your psych medications as instructed.  4.  Please ask your PCP to obtain 2 view chest x-ray in 4 to 6 weeks.  Follow with Primary MD  Katherina Mires, MD in 1-2 weeks  Please get a complete blood count and chemistry panel checked by your Primary MD at your next visit, and again as instructed by your Primary MD.  Get Medicines reviewed and adjusted: Please take all your medications with you for your next visit with your Primary MD  Laboratory/radiological data: Please request your Primary MD to go over all hospital tests and procedure/radiological results at the follow up, please ask your Primary MD to get all Hospital records sent to his/her office.  In some cases, they will be blood work, cultures and biopsy results pending at the time of your discharge. Please request that your primary care M.D. follows up on these results.  Also Note the following: If you experience worsening of your admission symptoms, develop shortness of breath, life threatening emergency, suicidal or homicidal thoughts you must seek medical attention immediately by calling 911 or calling your MD immediately  if symptoms less severe.  You must read complete instructions/literature along with all the possible adverse reactions/side effects for all the Medicines you take and that have been prescribed to you. Take any new Medicines after you have completely understood and accpet all the possible adverse reactions/side effects.   Do not drive when taking Pain medications or sleeping medications (Benzodaizepines)  Do not take more than  prescribed Pain, Sleep and Anxiety Medications. It is not advisable to combine anxiety,sleep and pain medications without talking with your primary care practitioner  Special Instructions: If you have smoked or chewed Tobacco  in the last 2 yrs please stop smoking, stop any regular Alcohol  and or any Recreational drug use.  Wear Seat belts while driving.  Please note: You were cared for by a hospitalist during your hospital stay. Once you are discharged, your primary care physician will handle any further medical issues. Please note that NO REFILLS for any discharge medications will be authorized once you are discharged, as it is imperative that you return to your primary care physician (or establish a relationship with a primary care physician if you do not have one) for your post hospital discharge needs so that they can reassess your need for medications and monitor your lab values.   Increase activity  slowly   Complete by: As directed      Allergies as of 08/21/2020      Reactions   Penicillins Anaphylaxis, Swelling, Rash, Other (See Comments)   "Made my throat swell"   Strawberry Extract Rash      Medication List    STOP taking these medications   azithromycin 250 MG tablet Commonly known as: Zithromax Z-Pak   DAYQUIL MULTI-SYMPTOM COLD/FLU PO   dextromethorphan-guaiFENesin 30-600 MG 12hr tablet Commonly known as: MUCINEX DM   lamoTRIgine 100 MG tablet Commonly known as: LAMICTAL   naproxen sodium 220 MG tablet Commonly known as: ALEVE   predniSONE 10 MG (21) Tbpk tablet Commonly known as: STERAPRED UNI-PAK 21 TAB Replaced by: predniSONE 10 MG tablet   QUEtiapine 300 MG 24 hr tablet Commonly known as: SEROQUEL XR     TAKE these medications   albuterol 108 (90 Base) MCG/ACT inhaler Commonly known as: VENTOLIN HFA Inhale 2 puffs into the lungs every 6 (six) hours as needed for wheezing or shortness of breath.   benzonatate 100 MG capsule Commonly known as:  Tessalon Perles Take 1 capsule (100 mg total) by mouth every 6 (six) hours as needed for cough.   predniSONE 10 MG tablet Commonly known as: DELTASONE Take 4 tablets (40 mg) daily for 2 days, then, Take 3 tablets (30 mg) daily for 2 days, then, Take 2 tablets (20 mg) daily for 2 days, then, Take 1 tablets (10 mg) daily for 1 days, then stop Replaces: predniSONE 10 MG (21) Tbpk tablet       Allergies  Allergen Reactions  . Penicillins Anaphylaxis, Swelling, Rash and Other (See Comments)    "Made my throat swell"  . Strawberry Extract Rash    Other Procedures/Studies: DG Chest Port 1 View  Result Date: 08/17/2020 CLINICAL DATA:  COVID symptoms.  COVID exposure. EXAM: PORTABLE CHEST 1 VIEW COMPARISON:  10/27/2017. FINDINGS: Heart size normal. Low lung volumes with diffuse prominent bilateral pulmonary infiltrates. COVID pneumonia could present in this fashion. No pleural effusion or pneumothorax. IMPRESSION: Low lung volumes with diffuse prominent bilateral pulmonary infiltrates. COVID pneumonia could present in this fashion. Electronically Signed   By: Marcello Moores  Register   On: 08/17/2020 13:32     TODAY-DAY OF DISCHARGE:  Subjective:   Antionio Negron today has no headache,no chest abdominal pain,no new weakness tingling or numbness, feels much better wants to go home today.   Objective:   Blood pressure 113/72, pulse 81, temperature 98 F (36.7 C), temperature source Oral, resp. rate 16, height 5' 10"  (1.778 m), weight 109.1 kg, SpO2 90 %.  Intake/Output Summary (Last 24 hours) at 08/21/2020 0940 Last data filed at 08/21/2020 0900 Gross per 24 hour  Intake 870 ml  Output 450 ml  Net 420 ml   Filed Weights   08/17/20 1233 08/17/20 1800  Weight: 108.9 kg 109.1 kg    Exam: Awake Alert, Oriented *3, No new F.N deficits, Normal affect [place].AT,PERRAL Supple Neck,No JVD, No cervical lymphadenopathy appriciated.  Symmetrical Chest wall movement, Good air movement bilaterally,  CTAB RRR,No Gallops,Rubs or new Murmurs, No Parasternal Heave +ve B.Sounds, Abd Soft, Non tender, No organomegaly appriciated, No rebound -guarding or rigidity. No Cyanosis, Clubbing or edema, No new Rash or bruise   PERTINENT RADIOLOGIC STUDIES: DG Chest Port 1 View  Result Date: 08/17/2020 CLINICAL DATA:  COVID symptoms.  COVID exposure. EXAM: PORTABLE CHEST 1 VIEW COMPARISON:  10/27/2017. FINDINGS: Heart size normal. Low lung volumes with diffuse prominent bilateral pulmonary  infiltrates. COVID pneumonia could present in this fashion. No pleural effusion or pneumothorax. IMPRESSION: Low lung volumes with diffuse prominent bilateral pulmonary infiltrates. COVID pneumonia could present in this fashion. Electronically Signed   By: Marcello Moores  Register   On: 08/17/2020 13:32     PERTINENT LAB RESULTS: CBC: Recent Labs    08/20/20 0433 08/21/20 0202  WBC 11.1* 10.2  HGB 13.5 14.1  HCT 40.6 41.3  PLT 275 307   CMET CMP     Component Value Date/Time   NA 136 08/21/2020 0202   K 4.4 08/21/2020 0202   CL 103 08/21/2020 0202   CO2 22 08/21/2020 0202   GLUCOSE 204 (H) 08/21/2020 0202   BUN 18 08/21/2020 0202   CREATININE 0.76 08/21/2020 0202   CALCIUM 8.6 (L) 08/21/2020 0202   PROT 5.8 (L) 08/21/2020 0202   ALBUMIN 2.5 (L) 08/21/2020 0202   AST 75 (H) 08/21/2020 0202   ALT 136 (H) 08/21/2020 0202   ALKPHOS 47 08/21/2020 0202   BILITOT 0.4 08/21/2020 0202   GFRNONAA >60 08/21/2020 0202    GFR Estimated Creatinine Clearance: 144.1 mL/min (by C-G formula based on SCr of 0.76 mg/dL). No results for input(s): LIPASE, AMYLASE in the last 72 hours. No results for input(s): CKTOTAL, CKMB, CKMBINDEX, TROPONINI in the last 72 hours. Invalid input(s): POCBNP Recent Labs    08/20/20 0433 08/21/20 0202  DDIMER 0.47 0.50   No results for input(s): HGBA1C in the last 72 hours. No results for input(s): CHOL, HDL, LDLCALC, TRIG, CHOLHDL, LDLDIRECT in the last 72 hours. No results for  input(s): TSH, T4TOTAL, T3FREE, THYROIDAB in the last 72 hours.  Invalid input(s): FREET3 No results for input(s): VITAMINB12, FOLATE, FERRITIN, TIBC, IRON, RETICCTPCT in the last 72 hours. Coags: No results for input(s): INR in the last 72 hours.  Invalid input(s): PT Microbiology: Recent Results (from the past 240 hour(s))  Respiratory Panel by RT PCR (Flu A&B, Covid) - Nasopharyngeal Swab     Status: Abnormal   Collection Time: 08/17/20 12:39 PM   Specimen: Nasopharyngeal Swab  Result Value Ref Range Status   SARS Coronavirus 2 by RT PCR POSITIVE (A) NEGATIVE Final    Comment: RESULT CALLED TO, READ BACK BY AND VERIFIED WITH: [place] OJ [id number] PHILLIPS C (NOTE) SARS-CoV-2 target nucleic acids are DETECTED.  SARS-CoV-2 RNA is generally detectable in upper respiratory specimens  during the acute phase of infection. Positive results are indicative of the presence of the identified virus, but do not rule out bacterial infection or co-infection with other pathogens not detected by the test. Clinical correlation with patient history and other diagnostic information is necessary to determine patient infection status. The expected result is Negative.  Fact Sheet for Patients:  PinkCheek.be  Fact Sheet for Healthcare Providers: GravelBags.it  This test is not yet approved or cleared by the [place] FDA and  has been authorized for detection and/or diagnosis of SARS-CoV-2 by FDA under an Emergency Use Authorization (EUA).  This EUA will remain in effect (meaning this test can  be used) for the duration of  the COVID-19 declaration under Section 564(b)(1) of the Act, 21 U.S.C. section 360bbb-3(b)(1), unless the authorization is terminated or revoked sooner.      Influenza A by PCR NEGATIVE NEGATIVE Final   Influenza B by PCR NEGATIVE NEGATIVE Final    Comment: (NOTE) The Xpert Xpress SARS-CoV-2/FLU/RSV assay  is intended as an aid in  the diagnosis of influenza from Nasopharyngeal swab specimens and  should not be used as a sole basis for treatment. Nasal washings and  aspirates are unacceptable for Xpert Xpress SARS-CoV-2/FLU/RSV  testing.  Fact Sheet for Patients: PinkCheek.be  Fact Sheet for Healthcare Providers: GravelBags.it  This test is not yet approved or cleared by the [place] FDA and  has been authorized for detection and/or diagnosis of SARS-CoV-2 by  FDA under an Emergency Use Authorization (EUA). This EUA will remain  in effect (meaning this test can be used) for the duration of the  Covid-19 declaration under Section 564(b)(1) of the Act, 21  U.S.C. section 360bbb-3(b)(1), unless the authorization is  terminated or revoked. Performed at [facility], [address] [place], [place] [address]   Blood Culture (routine x 2)     Status: None (Preliminary result)   Collection Time: 08/17/20  1:28 PM   Specimen: BLOOD  Result Value Ref Range Status   Specimen Description   Final    BLOOD RIGHT ANTECUBITAL Performed at [facility], [address] [place], [place] [address]    Special Requests   Final    BOTTLES DRAWN AEROBIC AND ANAEROBIC Blood Culture adequate volume Performed at [facility], [address] [place], [place] [address]    Culture   Final    NO GROWTH 4 DAYS Performed at [facility], [place] [address], [place], [place] [address]    Report Status PENDING  Incomplete  Blood Culture (routine x 2)     Status: None (Preliminary result)   Collection Time: 08/17/20  1:35 PM   Specimen: BLOOD  Result Value Ref Range Status   Specimen Description   Final    BLOOD RIGHT FOREARM Performed at [facility], [address] [place], [place] [address]    Special Requests   Final    BOTTLES DRAWN AEROBIC AND ANAEROBIC Blood Culture adequate  volume Performed at [facility], [address] [place], [place] [address]    Culture   Final    NO GROWTH 4 DAYS Performed at [facility], [place] [address] [place], [place] [address]    Report Status PENDING  Incomplete    FURTHER DISCHARGE INSTRUCTIONS:  Get Medicines reviewed and adjusted: Please take all your medications with you for your next visit with your Primary MD  Laboratory/radiological data: Please request your Primary MD to go over all hospital tests and procedure/radiological results at the follow up, please ask your Primary MD to get all Hospital records sent to his/her office.  In some cases, they will be blood work, cultures and biopsy results pending at the time of your discharge. Please request that your primary care M.D. goes through all the records of your hospital data and follows up on these results.  Also Note the following: If you experience worsening of your admission symptoms, develop shortness of breath, life threatening emergency, suicidal or homicidal thoughts you must seek medical attention immediately by calling 911 or calling your MD immediately  if symptoms less severe.  You must read complete instructions/literature along with all the possible adverse reactions/side effects for all the Medicines you take and that have been prescribed to you. Take any new Medicines after you have completely understood and accpet all the possible adverse reactions/side effects.   Do not drive when taking Pain medications or sleeping medications (Benzodaizepines)  Do not take more than prescribed Pain, Sleep and Anxiety Medications. It is  not advisable to combine anxiety,sleep and pain medications without talking with your primary care practitioner  Special Instructions: If you have smoked or chewed Tobacco  in the last 2 yrs please stop smoking, stop any regular Alcohol  and or any Recreational drug use.  Wear Seat belts while driving.  Please  note: You were cared for by a hospitalist during your hospital stay. Once you are discharged, your primary care physician will handle any further medical issues. Please note that NO REFILLS for any discharge medications will be authorized once you are discharged, as it is imperative that you return to your primary care physician (or establish a relationship with a primary care physician if you do not have one) for your post hospital discharge needs so that they can reassess your need for medications and monitor your lab values.  Total Time spent coordinating discharge including counseling, education and face to face time equals 35 minutes.  SignedOren Binet 08/21/2020 9:40 AM

## 2020-08-21 NOTE — Discharge Instructions (Signed)
Person Under Monitoring Name: Jerry Woods  Location: [facility] [place] [address]   Infection Prevention Recommendations for Individuals Confirmed to have, or Being Evaluated for, 2019 Novel Coronavirus (COVID-19) Infection Who Receive Care at Home  Individuals who are confirmed to have, or are being evaluated for, COVID-19 should follow the prevention steps below until a healthcare provider or local or state health department says they can return to normal activities.  Stay home except to get medical care You should restrict activities outside your home, except for getting medical care. Do not go to work, school, or public areas, and do not use public transportation or taxis.  Call ahead before visiting your doctor Before your medical appointment, call the healthcare provider and tell them that you have, or are being evaluated for, COVID-19 infection. This will help the healthcare providers office take steps to keep other people from getting infected. Ask your healthcare provider to call the local or state health department.  Monitor your symptoms Seek prompt medical attention if your illness is worsening (e.g., difficulty breathing). Before going to your medical appointment, call the healthcare provider and tell them that you have, or are being evaluated for, COVID-19 infection. Ask your healthcare provider to call the local or state health department.  Wear a facemask You should wear a facemask that covers your nose and mouth when you are in the same room with other people and when you visit a healthcare provider. People who live with or visit you should also wear a facemask while they are in the same room with you.  Separate yourself from other people in your home As much as possible, you should stay in a different room from other people in your home. Also, you should use a separate bathroom, if available.  Avoid sharing household items You should  not share dishes, drinking glasses, cups, eating utensils, towels, bedding, or other items with other people in your home. After using these items, you should wash them thoroughly with soap and water.  Cover your coughs and sneezes Cover your mouth and nose with a tissue when you cough or sneeze, or you can cough or sneeze into your sleeve. Throw used tissues in a lined trash can, and immediately wash your hands with soap and water for at least 20 seconds or use an alcohol-based hand rub.  Wash your [facility] your hands often and thoroughly with soap and water for at least 20 seconds. You can use an alcohol-based hand sanitizer if soap and water are not available and if your hands are not visibly dirty. Avoid touching your eyes, nose, and mouth with unwashed hands.   Prevention Steps for Caregivers and Household Members of Individuals Confirmed to have, or Being Evaluated for, COVID-19 Infection Being Cared for in the Home  If you live with, or provide care at home for, a person confirmed to have, or being evaluated for, COVID-19 infection please follow these guidelines to prevent infection:  Follow healthcare providers instructions Make sure that you understand and can help the patient follow any healthcare provider instructions for all care.  Provide for the patients basic needs You should help the patient with basic needs in the home and provide support for getting groceries, prescriptions, and other personal needs.  Monitor the patients symptoms If they are getting sicker, call his or her medical provider and tell them that the patient has, or is being evaluated for, COVID-19 infection. This will help the healthcare providers  office take steps to keep other people from getting infected. Ask the healthcare provider to call the local or state health department.  Limit the number of people who have contact with the patient  If possible, have only one caregiver for the  patient.  Other household members should stay in another home or place of residence. If this is not possible, they should stay  in another room, or be separated from the patient as much as possible. Use a separate bathroom, if available.  Restrict visitors who do not have an essential need to be in the home.  Keep older adults, very young children, and other sick people away from the patient Keep older adults, very young children, and those who have compromised immune systems or chronic health conditions away from the patient. This includes people with chronic heart, lung, or kidney conditions, diabetes, and cancer.  Ensure good ventilation Make sure that shared spaces in the home have good air flow, such as from an air conditioner or an opened window, weather permitting.  Wash your hands often  Wash your hands often and thoroughly with soap and water for at least 20 seconds. You can use an alcohol based hand sanitizer if soap and water are not available and if your hands are not visibly dirty.  Avoid touching your eyes, nose, and mouth with unwashed hands.  Use disposable paper towels to dry your hands. If not available, use dedicated cloth towels and replace them when they become wet.  Wear a facemask and gloves  Wear a disposable facemask at all times in the room and gloves when you touch or have contact with the patients blood, body fluids, and/or secretions or excretions, such as sweat, saliva, sputum, nasal mucus, vomit, urine, or feces.  Ensure the mask fits over your nose and mouth tightly, and do not touch it during use.  Throw out disposable facemasks and gloves after using them. Do not reuse.  Wash your hands immediately after removing your facemask and gloves.  If your personal clothing becomes contaminated, carefully remove clothing and launder. Wash your hands after handling contaminated clothing.  Place all used disposable facemasks, gloves, and other waste in a lined  container before disposing them with other household waste.  Remove gloves and wash your hands immediately after handling these items.  Do not share dishes, glasses, or other household items with the patient  Avoid sharing household items. You should not share dishes, drinking glasses, cups, eating utensils, towels, bedding, or other items with a patient who is confirmed to have, or being evaluated for, COVID-19 infection.  After the person uses these items, you should wash them thoroughly with soap and water.  Wash laundry thoroughly  Immediately remove and wash clothes or bedding that have blood, body fluids, and/or secretions or excretions, such as sweat, saliva, sputum, nasal mucus, vomit, urine, or feces, on them.  Wear gloves when handling laundry from the patient.  Read and follow directions on labels of laundry or clothing items and detergent. In general, wash and dry with the warmest temperatures recommended on the label.  Clean all areas the individual has used often  Clean all touchable surfaces, such as counters, tabletops, doorknobs, bathroom fixtures, toilets, phones, keyboards, tablets, and bedside tables, every day. Also, clean any surfaces that may have blood, body fluids, and/or secretions or excretions on them.  Wear gloves when cleaning surfaces the patient has come in contact with.  Use a diluted bleach solution (e.g., dilute bleach with 1  part bleach and 10 parts water) or a household disinfectant with a label that says EPA-registered for coronaviruses. To make a bleach solution at home, add 1 tablespoon of bleach to 1 quart (4 cups) of water. For a larger supply, add  cup of bleach to 1 gallon (16 cups) of water.  Read labels of cleaning products and follow recommendations provided on product labels. Labels contain instructions for safe and effective use of the cleaning product including precautions you should take when applying the product, such as wearing gloves or  eye protection and making sure you have good ventilation during use of the product.  Remove gloves and wash hands immediately after cleaning.  Monitor yourself for signs and symptoms of illness Caregivers and household members are considered close contacts, should monitor their health, and will be asked to limit movement outside of the home to the extent possible. Follow the monitoring steps for close contacts listed on the symptom monitoring form.   ? If you have additional questions, contact your local health department or call the epidemiologist on call at [phone] (available 24/7). ? This guidance is subject to change. For the most up-to-date guidance from [facility], please refer to their website: [email or web address]

## 2020-08-21 NOTE — Care Management Important Message (Signed)
Important Message  Patient Details  Name: Jerry Woods MRN: [id number] Date of Birth: 1975-12-05   Medicare Important Message Given:  Yes - Important Message mailed due to current National Emergency  Verbal consent obtained due to current National Emergency  Relationship to patient: Self Contact Name: Estevon Fluke Call Date: 08/21/20  Time: 1328 Phone: [phone] Outcome: No Answer/Busy Important Message mailed to: Patient address on file    Delorse Lek 08/21/2020, 1:29 PM

## 2020-08-22 LAB — CULTURE, BLOOD (ROUTINE X 2)
Culture: NO GROWTH
Culture: NO GROWTH
Special Requests: ADEQUATE
Special Requests: ADEQUATE
